# Patient Record
Sex: Female | Born: 2000 | Hispanic: No | Marital: Single | Smoking: Never smoker
Health system: Southern US, Community
[De-identification: ages and names within clinical notes are randomized; demographics above are authoritative.]

## PROBLEM LIST (undated history)

## (undated) DIAGNOSIS — Z889 Allergy status to unspecified drugs, medicaments and biological substances status: Secondary | ICD-10-CM

## (undated) DIAGNOSIS — F419 Anxiety disorder, unspecified: Secondary | ICD-10-CM

## (undated) DIAGNOSIS — F32A Depression, unspecified: Secondary | ICD-10-CM

## (undated) HISTORY — PX: WISDOM TOOTH EXTRACTION: SHX21

---
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
## 2020-01-03 ENCOUNTER — Encounter: Payer: Medicaid Other | Admitting: Family Medicine

## 2020-02-18 ENCOUNTER — Ambulatory Visit (INDEPENDENT_AMBULATORY_CARE_PROVIDER_SITE_OTHER): Payer: Medicaid Other | Admitting: Nurse Practitioner

## 2020-02-18 ENCOUNTER — Other Ambulatory Visit: Payer: Self-pay

## 2020-02-18 ENCOUNTER — Encounter: Payer: Self-pay | Admitting: Nurse Practitioner

## 2020-02-18 VITALS — BP 122/78 | HR 107 | Temp 98.0°F | Ht 62.0 in | Wt 194.6 lb

## 2020-02-18 DIAGNOSIS — E161 Other hypoglycemia: Secondary | ICD-10-CM | POA: Diagnosis not present

## 2020-02-18 DIAGNOSIS — Z1321 Encounter for screening for nutritional disorder: Secondary | ICD-10-CM | POA: Diagnosis not present

## 2020-02-18 DIAGNOSIS — Z79899 Other long term (current) drug therapy: Secondary | ICD-10-CM | POA: Diagnosis not present

## 2020-02-18 DIAGNOSIS — F419 Anxiety disorder, unspecified: Secondary | ICD-10-CM | POA: Diagnosis not present

## 2020-02-18 DIAGNOSIS — Z01419 Encounter for gynecological examination (general) (routine) without abnormal findings: Secondary | ICD-10-CM

## 2020-02-18 DIAGNOSIS — Z23 Encounter for immunization: Secondary | ICD-10-CM

## 2020-02-18 DIAGNOSIS — Z13220 Encounter for screening for lipoid disorders: Secondary | ICD-10-CM | POA: Diagnosis not present

## 2020-02-18 MED ORDER — SERTRALINE HCL 50 MG PO TABS: ORAL_TABLET | ORAL | 0 refills | Status: DC

## 2020-02-18 NOTE — Patient Instructions (Addendum)
Metamucil Fiber. This is a powder that can be mixed into your drink, it is flavorless and will help with constipation.    Try over the counter Omeprazole for reflux.     Barrett's Esophagus  Barrett's esophagus occurs when the tissue that lines the esophagus changes or becomes damaged. The esophagus is the tube that carries food from the throat to the stomach. With Barrett's esophagus, the cells that line the esophagus are replaced by cells that are similar to the lining of the intestines (intestinal metaplasia). Barrett's esophagus itself may not cause any symptoms. However, many people who have Barrett's esophagus also have gastroesophageal reflux disease (GERD), which may cause symptoms such as heartburn. Over time, a few people with this condition may develop cancer of the esophagus. Treatment may include medicines, procedures to destroy the abnormal cells, or surgery. What are the causes? The exact cause of this condition is not known. In some cases, the condition develops from damage to the lining of the esophagus caused by GERD. GERD occurs when stomach acids flow up from the stomach into the esophagus. Frequent symptoms of GERD may cause intestinal metaplasia or cause cell changes (dysplasia). What increases the risk? You are more likely to develop this condition if you:  Have GERD.  Are female.  Are Caucasian.  Are obese.  Are older than 50.  Have a hiatal hernia. This is a condition in which part of your stomach bulges into your chest.  Smoke. What are the signs or symptoms? People with Barrett's esophagus often have no symptoms. However, many people with this condition also have GERD. Symptoms of GERD may include:  Heartburn.  Difficulty swallowing.  Dry cough. How is this diagnosed? This condition may be diagnosed based on:  Results of an upper gastrointestinal endoscopy. For this exam, a thin, flexible tube with a light and a camera on the end (endoscope) is passed  down your esophagus. Your health care provider can view the inside of your esophagus during this procedure.  Results of a biopsy. For this procedure, several tissue samples are removed (biopsy) from your esophagus. They are then checked for intestinal metaplasia or dysplasia. How is this treated? Treatment for this condition may include:  Medicines (proton pump inhibitors, or PPIs) to decrease or stop GERD.  Periodic endoscopic exams to make sure that cancer is not developing.  A procedure or surgery for dysplasia. This may include: ? Removal or destruction of abnormal cells. ? Removal of part of the esophagus (esophagectomy). Follow these instructions at home: Eating and drinking  Eat more fruits and vegetables.  Avoid fatty foods.  Eat small, frequent meals instead of large meals.  Avoid foods that cause heartburn. These foods include: ? Coffee and alcoholic drinks. ? Tomatoes and foods made with tomatoes. ? Greasy or spicy foods. ? Chocolate and peppermint.  Do not drink alcohol. General instructions  Take over-the-counter and prescription medicines only as told by your health care provider.  Do not use any products that contain nicotine or tobacco, such as cigarettes and e-cigarettes. If you need help quitting, ask your health care provider.  If you are being treated for GERD, make sure you take medicines and follow all instructions as told by your health care provider.  Keep all follow-up visits as told by your health care provider. This is important. Contact a health care provider if:  You have heartburn or GERD symptoms.  You have difficulty swallowing. Get help right away if:  You have chest pain.  You are unable to swallow.  You vomit blood or material that looks like coffee grounds.  Your stool (feces) is bright red or dark. Summary  Barrett's esophagus occurs when the tissue that lines the esophagus changes or becomes damaged.  Barrett's esophagus  may be diagnosed with an upper gastrointestinal endoscopy and a biopsy.  Treatment may include medicines, procedures to remove abnormal cells, or surgery.  Follow your health care provider's instructions about what to eat and drink, what medicines to take, and when to call for help. This information is not intended to replace advice given to you by your health care provider. Make sure you discuss any questions you have with your health care provider. Document Revised: 08/25/2017 Document Reviewed: 08/25/2017 Elsevier Patient Education  [place].
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
## 2020-02-20 ENCOUNTER — Encounter: Payer: Self-pay | Admitting: Nurse Practitioner

## 2020-02-20 NOTE — Progress Notes (Signed)
   Subjective:    Patient ID: Michelle [name], female    DOB: 01/19/2001, 19 y.o.   MRN: [id number]  HPI    Review of Systems     Objective:   Physical Exam        Assessment & Plan:

## 2020-02-27 LAB — CBC WITH DIFFERENTIAL/PLATELET
Basophils Absolute: 0 10*3/uL (ref 0.0–0.2)
Basos: 1 %
EOS (ABSOLUTE): 0.1 10*3/uL (ref 0.0–0.4)
Eos: 1 %
Hematocrit: 38.1 % (ref 34.0–46.6)
Hemoglobin: 12.6 g/dL (ref 11.1–15.9)
Immature Grans (Abs): 0 10*3/uL (ref 0.0–0.1)
Immature Granulocytes: 0 %
Lymphocytes Absolute: 1.8 10*3/uL (ref 0.7–3.1)
Lymphs: 29 %
MCH: 28.3 pg (ref 26.6–33.0)
MCHC: 33.1 g/dL (ref 31.5–35.7)
MCV: 85 fL (ref 79–97)
Monocytes Absolute: 0.4 10*3/uL (ref 0.1–0.9)
Monocytes: 7 %
Neutrophils Absolute: 4 10*3/uL (ref 1.4–7.0)
Neutrophils: 62 %
Platelets: 317 10*3/uL (ref 150–450)
RBC: 4.46 x10E6/uL (ref 3.77–5.28)
RDW: 13.5 % (ref 11.7–15.4)
WBC: 6.4 10*3/uL (ref 3.4–10.8)

## 2020-02-27 LAB — COMPREHENSIVE METABOLIC PANEL
ALT: 14 IU/L (ref 0–32)
AST: 19 IU/L (ref 0–40)
Albumin/Globulin Ratio: 1.6 (ref 1.2–2.2)
Albumin: 4.7 g/dL (ref 3.9–5.0)
Alkaline Phosphatase: 93 IU/L (ref 42–106)
BUN/Creatinine Ratio: 10 (ref 9–23)
BUN: 6 mg/dL (ref 6–20)
Bilirubin Total: 0.4 mg/dL (ref 0.0–1.2)
CO2: 22 mmol/L (ref 20–29)
Calcium: 9.5 mg/dL (ref 8.7–10.2)
Chloride: 103 mmol/L (ref 96–106)
Creatinine, Ser: 0.58 mg/dL (ref 0.57–1.00)
GFR calc Af Amer: 155 mL/min/{1.73_m2} (ref >59)
GFR calc non Af Amer: 134 mL/min/{1.73_m2} (ref >59)
Globulin, Total: 2.9 g/dL (ref 1.5–4.5)
Glucose: 87 mg/dL (ref 65–99)
Potassium: 4.7 mmol/L (ref 3.5–5.2)
Sodium: 137 mmol/L (ref 134–144)
Total Protein: 7.6 g/dL (ref 6.0–8.5)

## 2020-02-27 LAB — LIPID PANEL
Chol/HDL Ratio: 4.1 ratio (ref 0.0–4.4)
Cholesterol, Total: 186 mg/dL — ABNORMAL HIGH (ref 100–169)
HDL: 45 mg/dL (ref >39)
LDL Chol Calc (NIH): 120 mg/dL — ABNORMAL HIGH (ref 0–109)
Triglycerides: 118 mg/dL — ABNORMAL HIGH (ref 0–89)
VLDL Cholesterol Cal: 21 mg/dL (ref 5–40)

## 2020-02-27 LAB — HEMOGLOBIN A1C
Est. average glucose Bld gHb Est-mCnc: 117 mg/dL
Hgb A1c MFr Bld: 5.7 % — ABNORMAL HIGH (ref 4.8–5.6)

## 2020-02-27 LAB — VITAMIN D 25 HYDROXY (VIT D DEFICIENCY, FRACTURES): Vit D, 25-Hydroxy: 13.8 ng/mL — ABNORMAL LOW (ref 30.0–100.0)

## 2020-02-27 LAB — INSULIN, FREE AND TOTAL
Free Insulin: 22 uU/mL — ABNORMAL HIGH
Total Insulin: 22 uU/mL

## 2020-02-28 ENCOUNTER — Telehealth: Payer: Self-pay | Admitting: *Deleted

## 2020-02-28 NOTE — Telephone Encounter (Signed)
Mom called requesting results of blood work from 10/8.

## 2020-02-29 ENCOUNTER — Other Ambulatory Visit: Payer: Self-pay | Admitting: Nurse Practitioner

## 2020-02-29 MED ORDER — VITAMIN D (ERGOCALCIFEROL) 1.25 MG (50000 UNIT) PO CAPS: 50000.0000 [IU] | ORAL_CAPSULE | ORAL | 2 refills | Status: DC

## 2020-02-29 NOTE — Telephone Encounter (Signed)
See note on lab report

## 2020-03-17 ENCOUNTER — Other Ambulatory Visit: Payer: Self-pay

## 2020-03-17 ENCOUNTER — Encounter: Payer: Medicaid Other | Admitting: Nurse Practitioner

## 2020-03-24 ENCOUNTER — Telehealth: Payer: Self-pay | Admitting: Family Medicine

## 2020-03-24 ENCOUNTER — Encounter: Payer: Medicaid Other | Admitting: Nurse Practitioner

## 2020-03-24 NOTE — Progress Notes (Deleted)
   Subjective:    Patient ID: Michelle [name], female    DOB: 2001/05/09, 19 y.o.   MRN: [id number]  HPI Pt here for follow up on Anxiety. Pt states she is doing well on Zoloft.  Virtual Visit via Video Note  I connected with Michelle [name] on 03/24/20 at 11:20 AM EST by a video enabled telemedicine application and verified that I am speaking with the correct person using two identifiers.  Location: Patient: home Provider: provider   I discussed the limitations of evaluation and management by telemedicine and the availability of in person appointments. The patient expressed understanding and agreed to proceed.  History of Present Illness:    Observations/Objective:   Assessment and Plan:   Follow Up Instructions:    I discussed the assessment and treatment plan with the patient. The patient was provided an opportunity to ask questions and all were answered. The patient agreed with the plan and demonstrated an understanding of the instructions.   The patient was advised to call back or seek an in-person evaluation if the symptoms worsen or if the condition fails to improve as anticipated.  I provided *** minutes of non-face-to-face time during this encounter.     Called at 12:08; no answer  Review of Systems     Objective:   Physical Exam        Assessment & Plan:

## 2020-03-24 NOTE — Telephone Encounter (Signed)
Ms. Michelle [name], Michelle [name] are scheduled for a virtual visit with your provider today.    Just as we do with appointments in the office, we must obtain your consent to participate.  Your consent will be active for this visit and any virtual visit you may have with one of our providers in the next 365 days.    If you have a MyChart account, I can also send a copy of this consent to you electronically.  All virtual visits are billed to your insurance company just like a traditional visit in the office.  As this is a virtual visit, video technology does not allow for your provider to perform a traditional examination.  This may limit your provider's ability to fully assess your condition.  If your provider identifies any concerns that need to be evaluated in person or the need to arrange testing such as labs, EKG, etc, we will make arrangements to do so.    Although advances in technology are sophisticated, we cannot ensure that it will always work on either your end or our end.  If the connection with a video visit is poor, we may have to switch to a telephone visit.  With either a video or telephone visit, we are not always able to ensure that we have a secure connection.   I need to obtain your verbal consent now.   Are you willing to proceed with your visit today?   Michelle [name] has provided verbal consent on 03/24/2020 for a virtual visit (video or telephone).   Vicente Males, LPN 20/60/1561  53:79 AM

## 2020-05-07 DIAGNOSIS — H5213 Myopia, bilateral: Secondary | ICD-10-CM | POA: Diagnosis not present

## 2020-07-07 ENCOUNTER — Ambulatory Visit: Payer: Medicaid Other | Admitting: Nurse Practitioner

## 2020-07-07 ENCOUNTER — Encounter: Payer: Self-pay | Admitting: Family Medicine

## 2020-07-28 ENCOUNTER — Encounter: Payer: Self-pay | Admitting: Family Medicine

## 2020-07-28 ENCOUNTER — Ambulatory Visit: Payer: Medicaid Other | Admitting: Family Medicine

## 2020-08-01 ENCOUNTER — Encounter: Payer: Self-pay | Admitting: Family Medicine

## 2021-06-19 ENCOUNTER — Ambulatory Visit
Admission: EM | Admit: 2021-06-19 | Discharge: 2021-06-19 | Disposition: A | Discharge status: Home / Self Care | Payer: Medicaid Other | Attending: Family Medicine | Admitting: Family Medicine

## 2021-06-19 ENCOUNTER — Ambulatory Visit (HOSPITAL_COMMUNITY)
Admission: EM | Admit: 2021-06-19 | Discharge: 2021-06-19 | Disposition: A | Discharge status: Home / Self Care | Payer: Medicaid Other | Attending: Registered Nurse | Admitting: Registered Nurse

## 2021-06-19 ENCOUNTER — Other Ambulatory Visit: Payer: Self-pay

## 2021-06-19 DIAGNOSIS — F32A Depression, unspecified: Secondary | ICD-10-CM

## 2021-06-19 DIAGNOSIS — F411 Generalized anxiety disorder: Secondary | ICD-10-CM

## 2021-06-19 DIAGNOSIS — F419 Anxiety disorder, unspecified: Secondary | ICD-10-CM | POA: Diagnosis not present

## 2021-06-19 DIAGNOSIS — F331 Major depressive disorder, recurrent, moderate: Secondary | ICD-10-CM

## 2021-06-19 MED ORDER — SERTRALINE HCL 50 MG PO TABS: 50.0000 mg | ORAL_TABLET | Freq: Every day | ORAL | 1 refills | Status: DC

## 2021-06-19 NOTE — Discharge Instructions (Signed)
[facility] Address: [address], [place], [place] [address] Phone: [phone]

## 2021-06-19 NOTE — ED Triage Notes (Signed)
Pt presents today for increase anxiety for the past several days. She just came out of a relationship and she is having trouble sleeping and eating. She reports taking zoloft in the past. She has been off zoloft for over a year.

## 2021-06-19 NOTE — ED Provider Notes (Signed)
[facility]    CSN: [id number] Arrival date & time: 06/19/21  1253      History   Chief Complaint Chief Complaint  Patient presents with   Anxiety    HPI Michelle [name] is a 21 y.o. female.   Presenting today with ongoing worsening issues with her anxiety following a break-up.  She states that there is a lot going on right now, many large stressors and she is having panic episodes overnight, having trouble sleeping and eating.  She used to be on Zoloft and did very well with this, no side effects while on the 50 mg dose.  She has been off for about a year now but is requesting to go back on the medication.  She is working on getting in with a primary care provider but wanting to go ahead and get started on the medication while waiting to get an appointment.  She denies any suicidal or homicidal ideation, thoughts of self-harm.   History reviewed. No pertinent past medical history.  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 No pertinent surgical history.  OB History   No obstetric history on file.    Home Medications    Prior to Admission medications   Medication Sig Start Date End Date Taking? Authorizing Provider  sertraline (ZOLOFT) 50 MG tablet Take 1 tablet (50 mg total) by mouth daily. 06/19/21   Volney American, PA-C  Vitamin D, Ergocalciferol, (DRISDOL) 1.25 MG (50000 UNIT) CAPS capsule Take 1 capsule (50,000 Units total) by mouth every 7 (seven) days. 02/29/20   Nilda Simmer, NP  metFORMIN (GLUCOPHAGE) 500 MG tablet Crush one tablet and take by mouth twice a day with foods 01/01/19 12/19/19  Nilda Simmer, NP   Family History History reviewed. No pertinent family history.  Social  History Social History   Tobacco Use   Smoking status: Never   Smokeless tobacco: Never  Vaping Use   Vaping Use: Never used  Substance Use Topics   Alcohol use: Never   Drug use: Never    Allergies   Patient has no known allergies.  Review of Systems Review of Systems Per HPI  Physical Exam Triage Vital Signs ED Triage Vitals [06/19/21 1316]  Enc Vitals Group     BP 137/86     Pulse Rate 99     Resp 16     Temp 98.1 F (36.7 C)     Temp Source Oral     SpO2 100 %     Weight      Height      Head Circumference      Peak Flow      Pain Score      Pain Loc      Pain Edu?      Excl. in [place]?    No data found.  Updated Vital Signs BP 137/86 (BP Location: Right Arm)    Pulse 99    Temp 98.1 F (36.7 C) (Oral)    Resp 16    LMP 05/16/2021 (Approximate)    SpO2 100%   Visual Acuity Right Eye Distance:   Left Eye Distance:   Bilateral Distance:    Right Eye Near:   Left Eye Near:    Bilateral Near:  Physical Exam Vitals and nursing note reviewed.  Constitutional:      Appearance: Normal appearance. She is not ill-appearing.  HENT:     Head: Atraumatic.     Mouth/Throat:     Mouth: Mucous membranes are moist.  Eyes:     Extraocular Movements: Extraocular movements intact.     Conjunctiva/sclera: Conjunctivae normal.  Cardiovascular:     Rate and Rhythm: Normal rate and regular rhythm.     Heart sounds: Normal heart sounds.  Pulmonary:     Effort: Pulmonary effort is normal.     Breath sounds: Normal breath sounds. No wheezing or rales.  Musculoskeletal:        General: Normal range of motion.     Cervical back: Normal range of motion and neck supple.  Skin:    General: Skin is warm and dry.  Neurological:     Mental Status: She is alert and oriented to person, place, and time.     Motor: No weakness.     Gait: Gait normal.  Psychiatric:        Mood and Affect: Mood normal.        Thought Content: Thought content normal.        Judgment:  Judgment normal.     UC Treatments / Results  Labs (all labs ordered are listed, but only abnormal results are displayed) Labs Reviewed - No data to display  EKG   Radiology No results found.  Procedures Procedures (including critical care time)  Medications Ordered in UC Medications - No data to display  Initial Impression / Assessment and Plan / UC Course  I have reviewed the triage vital signs and the nursing notes.  Pertinent labs & imaging results that were available during my care of the patient were reviewed by me and considered in my medical decision making (see chart for details).     Patient verbally declines any suicidal or homicidal ideation today.  She is requesting resources for counseling, primary care which were all given to her.  We will restart low-dose Zoloft while awaiting getting in with primary care or a specialist, discussed importance of close follow-up in the next few weeks to a month to check on how the medication is going.  She is aware to return sooner for any side effects or new concerns.  24-hour behavioral health urgent care information given additionally.  Final Clinical Impressions(s) / UC Diagnoses   Final diagnoses:  Anxiety and depression     Discharge Instructions      [facility] Address: [address] [place], [place] [address] Phone: [phone]    ED Prescriptions     Medication Sig Dispense Auth. Provider   sertraline (ZOLOFT) 50 MG tablet Take 1 tablet (50 mg total) by mouth daily. 30 tablet Volney American, [place]      PDMP not reviewed this encounter.   Volney American, [place] 06/19/21 1406

## 2021-06-19 NOTE — Progress Notes (Signed)
°   06/19/21 [place]  [place] (Walk-ins at [facility] only)  How Did You Hear About [place]? Self  What Is the Reason for Your Visit/Call Today? Pt is a 21 yo female who presented voluntarily and unaccompanied due to worsening depression and anxiety. Pt denied SI, HI, NSSH, AVH, paranoia and drug/alcohol use. Pt stated that she was started on a depression med, earlier today at an Urgent Care and was told to follow-up here. Pt stated that she broke up with her boyfriend of 3 years bout 2 months ago and has been getting more and more depressed since that time. Pt has no OP psych providers currently.  How Long Has This Been Causing You Problems? 1-6 months  Have You Recently Had Any Thoughts About Hurting Yourself? No  Are You Planning to Commit Suicide/Harm Yourself At This time? No  Have you Recently Had Thoughts About [place]? No  Are You Planning To Harm Someone At This Time? No  Are you currently experiencing any auditory, visual or other hallucinations? No  Have You Used Any Alcohol or Drugs in the Past 24 Hours? No  Do you have any current medical co-morbidities that require immediate attention? No  Clinician description of patient physical appearance/behavior: CAsually dressed, adequately groomed. Depressed mood with flat affect. Tearful at times. Alert and oriented x 4.  What Do You Feel Would Help You the Most Today? Treatment for Depression or other mood problem  If access to [facility] was not available, would you have sought care in the Emergency Department? Yes  Determination of Need Routine (7 days) (Per Ricky Ala NP, pt is recommended for OP treatment, PHP program.)   Lalitha Ilyas T. Mare Ferrari, Anton Ruiz, [facility], [facility] Triage Specialist [facility]

## 2021-06-19 NOTE — ED Provider Notes (Signed)
Behavioral Health Urgent Care Medical Screening Exam  Patient Name: Michelle [name] MRN: [id number] Date of Evaluation: 06/19/21 Chief Complaint:   Diagnosis:  Final diagnoses:  Moderate episode of recurrent major depressive disorder (HCC)  GAD (generalized anxiety disorder)    History of Present illness: Michelle [name] is a 21 y.o. female. patient presented to [facility] as a walk  complaints of worsening depression.  Michelle [name], 21 y.o., female patient seen face to face by this provider and chart reviewed on 06/19/21.  On evaluation Michelle [name] reports  Michelle [name] reported she was recently seen and evaluated by the local urgent care where she was restarted on Zoloft states she has been taking this medication as indicated however continues to express worsening anxiety and depressive symptoms.  She denies suicidal or homicidal ideations.  Denies auditory or visual hallucinations.  Continues to report ongoing ruminations related to a break-up.  Stated she and her boyfriend have been together for the past 3 years. "  We were together every day."  Discussed following up with partial hospitalization programming for additional coping skills and medication management.  Patient was receptive to plan.  During evaluation Michelle [name] is sitting  in no acute distress. She is alert/oriented x 4; calm/cooperative; and mood congruent with affect. She is speaking in a clear tone at moderate volume, and normal pace; with good eye contact. Her thought process is coherent and relevant; There is no indication that she is currently responding to internal/external stimuli or experiencing delusional thought content; and she has denied suicidal/self-harm/homicidal ideation, psychosis, and paranoia.  Patient has remained calm throughout assessment and has answered questions appropriately.     At this time Michelle [name] is educated and verbalizes understanding of mental health resources  and other crisis services in the community.She is instructed to call 911 and present to the nearest emergency room should she experience any suicidal/homicidal ideation, auditory/visual/hallucinations, or detrimental worsening of her mental health condition. She was a also advised by Probation officer that she could call the toll-free phone on insurance card to assist with identifying in network counselors and agencies or number on back of Medicaid card t speak with care coordinator    Psychiatric Specialty Exam  Presentation  General Appearance:Appropriate for Environment  Eye Contact:Good  Speech:Clear and Coherent  Speech Volume:Decreased  Handedness:No data recorded  Mood and Affect  Mood:Anxious (tearful)  Affect:Congruent   Thought Process  Thought Processes:Coherent  Descriptions of Associations:Intact  Orientation:Full (Time, Place and Person)  Thought Content:Logical    Hallucinations:None  Ideas of Reference:None  Suicidal Thoughts:No  Homicidal Thoughts:No   Sensorium  Memory:Immediate Fair; Recent Fair  Judgment:Fair  Insight:Fair   Executive Functions  Concentration:Fair  Attention Span:Good  [place]  Language:Good   Psychomotor Activity  Psychomotor Activity:Normal   Assets  Assets:Intimacy   Sleep  Sleep:Fair  Number of hours: No data recorded  Nutritional Assessment (For OBS and FBC admissions only) Has the patient had a weight loss or gain of 10 pounds or more in the last 3 months?: No Has the patient had a decrease in food intake/or appetite?: No Does the patient have dental problems?: No Does the patient have eating habits or behaviors that may be indicators of an eating disorder including binging or inducing vomiting?: No Has the patient recently lost weight without trying?: 0 Has the patient been eating poorly because of a decreased appetite?: 0 Malnutrition Screening Tool Score: 0    Physical  Exam: Physical Exam Vitals and nursing  note reviewed.  Cardiovascular:     Rate and Rhythm: Normal rate and regular rhythm.  Pulmonary:     Effort: Pulmonary effort is normal.  Neurological:     General: No focal deficit present.     Mental Status: She is alert and oriented to person, place, and time.  Psychiatric:        Mood and Affect: Mood normal.        Behavior: Behavior normal.        Thought Content: Thought content normal.   Review of Systems  Eyes: Negative.   Cardiovascular: Negative.   Gastrointestinal: Negative.   Psychiatric/Behavioral:  Positive for depression. The patient is nervous/anxious and has insomnia.   All other systems reviewed and are negative. There were no vitals taken for this visit. There is no height or weight on file to calculate BMI.  Musculoskeletal: Strength & Muscle Tone: within normal limits Gait & Station: normal Patient leans: N/A   [place] [facility] Discharge Disposition for Follow up and Recommendations: Based on my evaluation the patient does not appear to have an emergency medical condition and can be discharged with resources and follow up care in outpatient services for Medication Management, Partial Hospitalization Program, and Group Therapy   Derrill [place], NP 06/19/2021, 4:37 PM

## 2021-06-19 NOTE — Discharge Instructions (Signed)
Take all medications as prescribed. Keep all follow-up appointments as scheduled.  Do not consume alcohol or use illegal drugs while on prescription medications. Report any adverse effects from your medications to your primary care provider promptly.  In the event of recurrent symptoms or worsening symptoms, call 911, a crisis hotline, or go to the nearest emergency department for evaluation.   

## 2021-06-20 ENCOUNTER — Telehealth (HOSPITAL_COMMUNITY): Payer: Self-pay | Admitting: Licensed Clinical Social Worker

## 2021-06-25 ENCOUNTER — Telehealth (HOSPITAL_COMMUNITY): Payer: Self-pay | Admitting: Licensed Clinical Social Worker

## 2021-06-25 ENCOUNTER — Ambulatory Visit (HOSPITAL_COMMUNITY): Payer: Medicaid Other

## 2021-09-26 DIAGNOSIS — Z13220 Encounter for screening for lipoid disorders: Secondary | ICD-10-CM | POA: Diagnosis not present

## 2021-09-26 DIAGNOSIS — Z6829 Body mass index (BMI) 29.0-29.9, adult: Secondary | ICD-10-CM | POA: Diagnosis not present

## 2021-09-26 DIAGNOSIS — F064 Anxiety disorder due to known physiological condition: Secondary | ICD-10-CM | POA: Diagnosis not present

## 2021-09-26 DIAGNOSIS — E669 Obesity, unspecified: Secondary | ICD-10-CM | POA: Diagnosis not present

## 2021-09-26 DIAGNOSIS — Z131 Encounter for screening for diabetes mellitus: Secondary | ICD-10-CM | POA: Diagnosis not present

## 2021-09-29 NOTE — Progress Notes (Signed)
No show

## 2021-09-29 NOTE — Progress Notes (Signed)
Erroneous encounter

## 2022-02-11 DIAGNOSIS — B379 Candidiasis, unspecified: Secondary | ICD-10-CM | POA: Diagnosis not present

## 2022-02-11 DIAGNOSIS — M549 Dorsalgia, unspecified: Secondary | ICD-10-CM | POA: Diagnosis not present

## 2022-02-11 DIAGNOSIS — Z6835 Body mass index (BMI) 35.0-35.9, adult: Secondary | ICD-10-CM | POA: Diagnosis not present

## 2022-02-11 DIAGNOSIS — F064 Anxiety disorder due to known physiological condition: Secondary | ICD-10-CM | POA: Diagnosis not present

## 2022-02-11 DIAGNOSIS — Z0000 Encounter for general adult medical examination without abnormal findings: Secondary | ICD-10-CM | POA: Diagnosis not present

## 2022-02-11 DIAGNOSIS — E669 Obesity, unspecified: Secondary | ICD-10-CM | POA: Diagnosis not present

## 2022-03-07 ENCOUNTER — Ambulatory Visit: Payer: BC Managed Care – PPO | Admitting: Plastic Surgery

## 2022-03-07 ENCOUNTER — Encounter: Payer: Self-pay | Admitting: Plastic Surgery

## 2022-03-07 VITALS — BP 117/78 | HR 81 | Ht 63.0 in | Wt 208.0 lb

## 2022-03-07 DIAGNOSIS — M549 Dorsalgia, unspecified: Secondary | ICD-10-CM

## 2022-03-07 DIAGNOSIS — Z6836 Body mass index (BMI) 36.0-36.9, adult: Secondary | ICD-10-CM

## 2022-03-07 DIAGNOSIS — R293 Abnormal posture: Secondary | ICD-10-CM

## 2022-03-07 DIAGNOSIS — N62 Hypertrophy of breast: Secondary | ICD-10-CM | POA: Diagnosis not present

## 2022-03-07 DIAGNOSIS — R21 Rash and other nonspecific skin eruption: Secondary | ICD-10-CM

## 2022-03-07 NOTE — Progress Notes (Addendum)
Referring Provider No referring provider defined for this encounter.   CC:  Chief Complaint  Patient presents with   Advice Only      Michelle [name] is an 21 y.o. female.  HPI: 21 year old female referred for evaluation and management of symptomatic macromastia.  She relates a history of 5 back pain difficulty with upright posture difficulty with running and physical activity which she enjoys and persistent rashes between her breast due to the large size of her breast.  She is requesting surgical reduction in the size of her breast.  No Known Allergies  Outpatient Encounter Medications as of 03/07/2022  Medication Sig   sertraline (ZOLOFT) 50 MG tablet Take 1 tablet (50 mg total) by mouth daily. (Patient not taking: Reported on 03/07/2022)   Vitamin D, Ergocalciferol, (DRISDOL) 1.25 MG (50000 UNIT) CAPS capsule Take 1 capsule (50,000 Units total) by mouth every 7 (seven) days. (Patient not taking: Reported on 03/07/2022)   [DISCONTINUED] metFORMIN (GLUCOPHAGE) 500 MG tablet Crush one tablet and take by mouth twice a day with foods   No facility-administered encounter medications on file as of 03/07/2022.     No past medical history on file.  No past surgical history on file.  No family history on file.  Social History   Social History Narrative   Not on file     Review of Systems General: Denies fevers, chills, weight loss CV: Denies chest pain, shortness of breath, palpitations Breast: Large and pendulous bilaterally.  No obvious rashes today.  Physical Exam    03/07/2022   11:10 AM 06/19/2021    1:16 PM 02/18/2020   10:00 AM  Vitals with BMI  Height '5\' 3"'$   '5\' 2"'$   Weight 208 lbs  194 lbs 10 oz  BMI [id number]  [id number]  Systolic [id number] 0000000 [id number]  Diastolic 78 86 78  Pulse 81 99 107    General:  No acute distress,  Alert and oriented, Non-Toxic, Normal speech and affect Breast: Patient has bilateral macromastia.  Grade 3 ptosis.  She has obvious grooving from her bra  straps in her shoulders.  Her breasts are quite large with a sternal notch to nipple distance of 39 cm on the right and 39 cm on the left.  She has a fold to nipple distance of 12 cm.  She has no obvious nipple abnormalities no nipple discharge and no palpable dominant masses. Mammography: Patient has not started mammographic screening.  Assessment/Plan Symptomatic macromastia: She has extremely large breasts though her BMI is 36.8.  We discussed the fact that her weight probably contributes to the size of her breasts, however I do not believe that even with the weight loss that the patient would not have difficulty with the large size of her breast.  I believe I can easily resect greater than 700 g per breast.  The patient and I with her mother present discussed the procedure at great length.  I showed her where the incisions were and we discussed the resultant scars which can be quite large and hypertrophic.  The outcome of the scars is based purely on biology and each person scars differently we discussed this at length as well.  Additionally we discussed the risks of bleeding infection seroma formation.  We discussed the risk of nipple loss due to ischemia.  We discussed the possibility of changes in sensation in the nipple and areas of anesthesia throughout the breast.  We also discussed the possibility that she would not be able to  successfully breast-feed after a breast reduction.  We discussed the fact that the breast will continue to change shape with normal aging.  The fact that if she becomes pregnant that her breast will most likely hypertrophy during pregnancy and may or may not return to their prepregnancy size post delivery.  We discussed the morning of surgery and the conduct of the procedure including of drains and compression postoperatively and the restrictions and physical activity for 6 weeks which include no heavy lifting no vigorous exercise and no submerging the incisions in water.  She  indicated that all her questions had been answered to her satisfaction and requested that I submit her for consideration of a breast reduction.  Michelle [name] 03/07/2022, 1:10 PM

## 2022-04-11 ENCOUNTER — Ambulatory Visit (HOSPITAL_COMMUNITY): Payer: Medicaid Other | Attending: Plastic Surgery | Admitting: Physical Therapy

## 2022-04-11 DIAGNOSIS — M5459 Other low back pain: Secondary | ICD-10-CM | POA: Insufficient documentation

## 2022-04-11 DIAGNOSIS — M549 Dorsalgia, unspecified: Secondary | ICD-10-CM | POA: Insufficient documentation

## 2022-04-11 DIAGNOSIS — N62 Hypertrophy of breast: Secondary | ICD-10-CM | POA: Insufficient documentation

## 2022-04-11 NOTE — Therapy (Signed)
OUTPATIENT PHYSICAL THERAPY THORACOLUMBAR EVALUATION   Patient Name: Michelle [name] MRN: [id number] DOB:[birth year], 21 y.o., female Today's Date: 04/11/2022  END OF SESSION:  PT End of Session - 04/11/22 0903     Visit Number 1    Number of Visits 12    Date for PT Re-Evaluation 05/23/22    Authorization Type Medicaid healthy [employer]    Authorization Time Period check auth    Authorization - Visit Number 1    Authorization - Number of Visits 1    Progress Note Due on Visit 10    PT Start Time 0901    PT Stop Time 0941    PT Time Calculation (min) 40 min    Activity Tolerance Patient tolerated treatment well    Behavior During Therapy [facility] for tasks assessed/performed             No past medical history on file. No past surgical history on file. 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 She is consulting with surgeon for breast reduction surgery but must go through therapy first. She does work out regularly but continues to have ongoing pain in mid back and lumbar with prolonged activity. She also has intermittent bout of sciatica.   PERTINENT HISTORY:  NA  PAIN:  Are you having pain? Yes: NPRS scale:  4-5/10 Pain location: mid-low back  Pain description: sore, constant Aggravating factors: prolonged activity, bending, lifting  Relieving factors: rest, stretching   PRECAUTIONS: None  WEIGHT BEARING RESTRICTIONS: No  FALLS:  Has patient fallen in last 6 months? No  LIVING ENVIRONMENT: Lives with: lives with their family Lives in: House/apartment  OCCUPATION: Substitute teacher  PLOF: Independent  PATIENT GOALS: "Complete what I need to complete"  NEXT MD VISIT:   OBJECTIVE:   DIAGNOSTIC FINDINGS:  NA  PATIENT SURVEYS:  Modified Oswestry 12% (6/50)  COGNITION: Overall cognitive status: Within functional limits for tasks assessed     SENSATION: WFL  MUSCLE LENGTH: WFL  POSTURE: rounded shoulders  PALPATION: Mod TTP about bilateral lumbar and thoracic paraspinals   LUMBAR ROM:   AROM eval  Flexion Full  Extension Full  Right lateral flexion Full  Left lateral flexion Full  Right rotation   Left rotation    (Blank rows = not tested)  LOWER EXTREMITY ROM:    WNL   LOWER EXTREMITY MMT:    MMT Right eval Left eval  Hip flexion 4+ 4+  Hip extension 4 4  Hip abduction 4 4  Hip adduction    Hip internal rotation    Hip external rotation    Knee flexion    Knee extension 4 4  Ankle dorsiflexion 4+ 5  Ankle plantarflexion    Ankle inversion    Ankle eversion     (Blank rows = not tested)  LUMBAR SPECIAL TESTS:  (-) SLR, (+) Instability   GAIT:  TODAY'S TREATMENT:                                                                                                                              DATE: 04/11/22 Eval     PATIENT EDUCATION:  Education details: on eval findings, POC and HEP  Person educated: Patient Education method: Explanation Education comprehension: verbalized understanding  HOME EXERCISE PROGRAM: Access Code: [email or web address] URL: [email or web address] Date: 04/11/2022 Prepared by: Josue Hector  Exercises - Supine Transversus Abdominis Bracing - Hands on Stomach  - 2-3 x daily - 7 x weekly - 2 sets - 10 reps - 5 sec hold - Supine March  - 2-3 x daily - 7 x weekly - 2 sets - 10 reps - Supine Bridge  - 2-3 x daily - 7 x weekly - 2 sets - 10 reps - 5 sec hold - Sidelying Hip Abduction  - 2-3 x daily - 7 x weekly - 2 sets - 10 reps  ASSESSMENT:  CLINICAL IMPRESSION: Patient is a 21 y.o. female who presents to physical therapy with complaint of mid and low back pain. Patient demonstrates muscle weakness, postural deficits, and fascial restrictions which are likely contributing to symptoms of pain and are negatively impacting patient ability to perform ADLs and functional mobility tasks. Patient will benefit from skilled physical therapy services to address these deficits to reduce pain and improve level of function with ADLs and functional mobility tasks.   OBJECTIVE IMPAIRMENTS: decreased activity tolerance, decreased strength, increased fascial restrictions, improper body mechanics, postural dysfunction, and pain.   ACTIVITY LIMITATIONS: carrying, lifting, bending, standing, and squatting  PARTICIPATION LIMITATIONS: meal prep, cleaning, laundry, driving, shopping, community activity, occupation, and yard work  PERSONAL FACTORS:  none  are also affecting patient's functional outcome.   REHAB POTENTIAL: Excellent  CLINICAL DECISION MAKING: Stable/uncomplicated  EVALUATION COMPLEXITY: Low   GOALS: SHORT TERM GOALS: Target date: 05/02/2022  Patient will be  independent with initial HEP and self-management strategies to improve functional outcomes Baseline:  Goal status: INITIAL   LONG TERM GOALS: Target date: 05/23/2022  Patient will be independent with advanced HEP and self-management strategies to improve functional outcomes Baseline:  Goal status: INITIAL  2.  Patient will improve Oswestry score by at least 5% to indicate improvement in functional  outcomes Baseline: 12% disability Goal status: INITIAL  3.  Patient will report reduction of back pain to <3/10 with activity for improved quality of life and ability to perform ADLs  Baseline: 4-5 Goal status: INITIAL  4. Patient will have 5/5 MMT throughout BLE to improve ability to perform functional mobility, stair ambulation and ADLs.  Baseline: See MMT Goal status: INITIAL PLAN:  PT FREQUENCY: 1-2x/week  PT DURATION: 6 weeks  PLANNED INTERVENTIONS: Therapeutic exercises, Therapeutic activity, Neuromuscular re-education, Balance training, Gait training, Patient/Family education, Joint manipulation, Joint mobilization, Stair training, Aquatic Therapy, Dry Needling, Electrical stimulation, Spinal manipulation, Spinal mobilization, Cryotherapy, Moist heat, scar mobilization, Taping, Traction, Ultrasound, Biofeedback, Ionotophoresis '4mg'$ /ml Dexamethasone, and Manual therapy. Marland Kitchen  PLAN FOR NEXT SESSION: Progress glute and core strength with focus on improved posturing.   9:39 AM, 04/11/22 Josue Hector PT DPT  Physical Therapist with [facility]  [phone]

## 2022-04-17 ENCOUNTER — Ambulatory Visit (HOSPITAL_COMMUNITY): Payer: Medicaid Other | Attending: Plastic Surgery | Admitting: Physical Therapy

## 2022-04-17 DIAGNOSIS — M5459 Other low back pain: Secondary | ICD-10-CM | POA: Diagnosis not present

## 2022-04-17 DIAGNOSIS — M549 Dorsalgia, unspecified: Secondary | ICD-10-CM | POA: Diagnosis not present

## 2022-04-17 NOTE — Therapy (Signed)
OUTPATIENT PHYSICAL THERAPY THORACOLUMBAR TREATMENT   Patient Name: Michelle [name] MRN: [id number] DOB:[birth year], 21 y.o., female Today's Date: 04/17/2022  END OF SESSION:  PT End of Session - 04/17/22 1112     Visit Number 2    Number of Visits 12    Date for PT Re-Evaluation 05/23/22    Authorization Type Medicaid healthy Blue    Authorization Time Period 4 visits approved 1/30-1/11/23    Authorization - Visit Number 1    Authorization - Number of Visits 4    Progress Note Due on Visit 10    PT Start Time 1111    PT Stop Time 1150    PT Time Calculation (min) 39 min    Activity Tolerance Patient tolerated treatment well    Behavior During Therapy WFL for tasks assessed/performed             No past medical history on file. No past surgical history on file. 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 No pain currently. HEP was fairly easy. Has been compliant.    Eval: Patient presets with ongoing complaint of mid and low back pain. She is consulting with surgeon for breast reduction surgery but must go through therapy first. She does work out regularly but continues to have ongoing pain in mid back and lumbar with prolonged activity. She also has  intermittent bout of sciatica.   PERTINENT HISTORY:  NA  PAIN:  Are you having pain? Yes: NPRS scale: 0/10 Pain location: mid-low back  Pain description: sore, constant Aggravating factors: prolonged activity, bending, lifting  Relieving factors: rest, stretching   PRECAUTIONS: None  WEIGHT BEARING RESTRICTIONS: No  FALLS:  Has patient fallen in last 6 months? No  LIVING ENVIRONMENT: Lives with: lives with their family Lives in: House/apartment  OCCUPATION: Substitute teacher  PLOF: Independent  PATIENT GOALS: "Complete what I need to complete"  NEXT MD VISIT:  OBJECTIVE:   DIAGNOSTIC FINDINGS:  NA  PATIENT SURVEYS:  Modified Oswestry 12% (6/50)    COGNITION: Overall cognitive status: Within functional limits for tasks assessed     SENSATION: WFL  MUSCLE LENGTH: WFL  POSTURE: rounded shoulders  PALPATION: Mod TTP about bilateral lumbar and thoracic paraspinals   LUMBAR ROM:   AROM eval  Flexion Full  Extension Full  Right lateral flexion Full  Left lateral flexion Full  Right rotation   Left rotation    (Blank rows = not tested)  LOWER EXTREMITY ROM:    WNL   LOWER EXTREMITY MMT:    MMT Right eval Left eval  Hip flexion 4+ 4+  Hip extension 4 4  Hip abduction 4 4  Hip adduction    Hip internal rotation    Hip external rotation    Knee flexion    Knee extension 4 4  Ankle dorsiflexion 4+ 5  Ankle plantarflexion    Ankle inversion    Ankle eversion     (Blank rows = not tested)  LUMBAR SPECIAL TESTS:  (-) SLR, (+) Instability   GAIT:  TODAY'S TREATMENT:                                                                                                                              DATE:  04/17/22 Ab brace 10 x 5" Ab march x 20  SLR with ab brace x 20 each  Dead bug 2 x 10 Bridge x20  Hip abduction iso 10 x 5" GTB  Hip adduction iso 10 x 5"  Band row GTB 2 x 10 Band shoulder extension  GTB 2 x 10 Horizontal shoulder  abduction with GTB 2 x 10  Standing pec stretch at wall 2 x 20"  04/11/22 Eval     PATIENT EDUCATION:  Education details: on eval findings, POC and HEP  Person educated: Patient Education method: Explanation Education comprehension: verbalized understanding  HOME EXERCISE PROGRAM: Access Code: [email or web address] URL: [email or web address] 04/17/22 - Hooklying Clamshell with Resistance  - 2-3 x daily - 7 x weekly - 2 sets - 10 reps - 5 second hold - Supine Hip Adduction Isometric with Ball  - 2-3 x daily - 7 x weekly - 2 sets - 10 reps - 5 second hold - Single Arm Doorway Pec Stretch at 90 Degrees Abduction  - 2-3 x daily - 7 x weekly - 1 sets - 3 reps - 30 second hold - Shoulder extension with resistance - Neutral  - 2-3 x daily - 7 x weekly - 2 sets - 10 reps - 5 second hold - Standing Shoulder Horizontal Abduction with Resistance  - 2-3 x daily - 7 x weekly - 2 sets - 10 reps - 5 second hold  Date: 04/11/2022 Prepared by: Josue Hector  Exercises - Supine Transversus Abdominis Bracing - Hands on Stomach  - 2-3 x daily - 7 x weekly - 2 sets - 10 reps -  5 sec hold - Supine March  - 2-3 x daily - 7 x weekly - 2 sets - 10 reps - Supine Bridge  - 2-3 x daily - 7 x weekly - 2 sets - 10 reps - 5 sec hold - Sidelying Hip Abduction  - 2-3 x daily - 7 x weekly - 2 sets - 10 reps  ASSESSMENT:  CLINICAL IMPRESSION: Progressed postural strengthening. Patient noting some discomfort about Rt SI with bridging, but improved following hip abduction/ adduction series. Ongoing challenge with deadbugs for core, noting increased pressure in RT side low back. Added band exercises and standing pec stretching at wall. Issued updated HEP handout. Patient will continue to benefit from skilled therapy services to reduce remaining deficits and improve functional ability.    OBJECTIVE IMPAIRMENTS: decreased activity tolerance, decreased strength, increased fascial restrictions, improper body  mechanics, postural dysfunction, and pain.   ACTIVITY LIMITATIONS: carrying, lifting, bending, standing, and squatting  PARTICIPATION LIMITATIONS: meal prep, cleaning, laundry, driving, shopping, community activity, occupation, and yard work  PERSONAL FACTORS:  none  are also affecting patient's functional outcome.   REHAB POTENTIAL: Excellent  CLINICAL DECISION MAKING: Stable/uncomplicated  EVALUATION COMPLEXITY: Low   GOALS: SHORT TERM GOALS: Target date: 05/02/2022  Patient will be independent with initial HEP and self-management strategies to improve functional outcomes Baseline:  Goal status: INITIAL   LONG TERM GOALS: Target date: 05/23/2022  Patient will be independent with advanced HEP and self-management strategies to improve functional outcomes Baseline:  Goal status: INITIAL  2.  Patient will improve Oswestry score by at least 5% to indicate improvement in functional outcomes Baseline: 12% disability Goal status: INITIAL  3.  Patient will report reduction of back pain to <3/10 with activity for improved quality of life and ability to perform ADLs  Baseline: 4-5 Goal status: INITIAL  4. Patient will have 5/5 MMT throughout BLE to improve ability to perform functional mobility, stair ambulation and ADLs.  Baseline: See MMT Goal status: INITIAL PLAN:  PT FREQUENCY: 1-2x/week  PT DURATION: 6 weeks  PLANNED INTERVENTIONS: Therapeutic exercises, Therapeutic activity, Neuromuscular re-education, Balance training, Gait training, Patient/Family education, Joint manipulation, Joint mobilization, Stair training, Aquatic Therapy, Dry Needling, Electrical stimulation, Spinal manipulation, Spinal mobilization, Cryotherapy, Moist heat, scar mobilization, Taping, Traction, Ultrasound, Biofeedback, Ionotophoresis '4mg'$ /ml Dexamethasone, and Manual therapy. Marland Kitchen  PLAN FOR NEXT SESSION: Progress glute and core strength with focus on improved posturing.   11:14 AM,  04/17/22 Josue Hector PT DPT  Physical Therapist with [facility]  [phone]

## 2022-04-22 ENCOUNTER — Encounter (HOSPITAL_COMMUNITY): Payer: Medicaid Other | Admitting: Physical Therapy

## 2022-04-27 ENCOUNTER — Ambulatory Visit
Admission: RE | Admit: 2022-04-27 | Discharge: 2022-04-27 | Disposition: A | Discharge status: Home / Self Care | Payer: Medicaid Other | Source: Ambulatory Visit | Attending: Family Medicine | Admitting: Family Medicine

## 2022-04-27 VITALS — BP 109/74 | HR 83 | Temp 98.2°F | Resp 18

## 2022-04-27 DIAGNOSIS — Z7984 Long term (current) use of oral hypoglycemic drugs: Secondary | ICD-10-CM | POA: Diagnosis not present

## 2022-04-27 DIAGNOSIS — J029 Acute pharyngitis, unspecified: Secondary | ICD-10-CM | POA: Diagnosis not present

## 2022-04-27 DIAGNOSIS — Z1152 Encounter for screening for COVID-19: Secondary | ICD-10-CM | POA: Diagnosis not present

## 2022-04-27 DIAGNOSIS — J069 Acute upper respiratory infection, unspecified: Secondary | ICD-10-CM | POA: Diagnosis not present

## 2022-04-27 DIAGNOSIS — R058 Other specified cough: Secondary | ICD-10-CM | POA: Insufficient documentation

## 2022-04-27 DIAGNOSIS — H6993 Unspecified Eustachian tube disorder, bilateral: Secondary | ICD-10-CM

## 2022-04-27 LAB — RESP PANEL BY RT-PCR (FLU A&B, COVID) ARPGX2
Influenza A by PCR: NEGATIVE
Influenza B by PCR: NEGATIVE
SARS Coronavirus 2 by RT PCR: NEGATIVE

## 2022-04-27 LAB — POCT RAPID STREP A (OFFICE): Rapid Strep A Screen: NEGATIVE

## 2022-04-27 MED ORDER — FLUTICASONE PROPIONATE 50 MCG/ACT NA SUSP: 1.0000 | Freq: Two times a day (BID) | NASAL | 2 refills | Status: DC

## 2022-04-27 MED ORDER — PSEUDOEPHEDRINE HCL ER 120 MG PO TB12: 120.0000 mg | ORAL_TABLET | Freq: Two times a day (BID) | ORAL | 0 refills | Status: DC | PRN

## 2022-04-27 MED ORDER — PROMETHAZINE-DM 6.25-15 MG/5ML PO SYRP: 5.0000 mL | ORAL_SOLUTION | Freq: Four times a day (QID) | ORAL | 0 refills | Status: DC | PRN

## 2022-04-27 NOTE — Discharge Instructions (Addendum)
Your strep test was negative today.  We have also tested you for COVID and flu and will let you know if anything comes back positive in the next day or so.  I recommend the medications that I have sent over to help with your symptoms.  You can also take ibuprofen, Tylenol, DayQuil, NyQuil

## 2022-04-27 NOTE — ED Triage Notes (Signed)
Pt reports left ear pain x 2 days. Then yesterday it felt clogged. Took ear drops but made it worst. Sore throat blood in mucus, and headace since yesterday, tea halls and cough meds which gave slight relief.    *Pt cannot take pill form of medication.*

## 2022-04-27 NOTE — ED Provider Notes (Signed)
[facility]    CSN: [id number] Arrival date & time: 04/27/22  0859      History   Chief Complaint Chief Complaint  Patient presents with   Ear Fullness    Entered by patient    HPI Michelle [name] is a 21 y.o. female.   Presenting today with 2-day history of left ear pain and pressure, sore throat, cough, congestion, sinus pressure, fatigue.  Denies fever, body aches, chest pain, shortness of breath, abdominal pain, nausea vomiting or diarrhea.  Trying eardrops and hot tea with lemon and ginger with no relief.  Multiple sick contacts recently.    History reviewed. No pertinent past medical history.  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 No pertinent surgical history.  OB History   No obstetric history on file.      Home Medications    Prior to Admission medications   Medication Sig Start Date End Date Taking? Authorizing Provider  fluticasone (FLONASE) 50 MCG/ACT nasal spray Place 1 spray into both nostrils 2 (two) times daily. 04/27/22  Yes Volney American, PA-C  promethazine-dextromethorphan (PROMETHAZINE-DM) 6.25-15 MG/5ML syrup Take 5 mLs by mouth 4 (four) times daily as needed. 04/27/22  Yes Volney American, PA-C  pseudoephedrine (SUDAFED 12 HOUR) 120 MG 12 hr tablet Take 1 tablet (120 mg total) by mouth every 12 (twelve) hours as needed for congestion. 04/27/22  Yes Volney American, PA-C  sertraline (ZOLOFT) 50 MG tablet Take 1 tablet (50 mg total) by mouth daily. Patient not taking: Reported on 03/07/2022 06/19/21   Volney American, PA-C  Vitamin D, Ergocalciferol, (DRISDOL) 1.25 MG (50000 UNIT) CAPS capsule Take 1 capsule (50,000 Units total) by mouth every  7 (seven) days. Patient not taking: Reported on 03/07/2022 02/29/20   Nilda Simmer, NP  metFORMIN (GLUCOPHAGE) 500 MG tablet Crush one tablet and take by mouth twice a day with foods 01/01/19 12/19/19  Nilda Simmer, NP    Family History History reviewed. No pertinent family history.  Social History Social History   Tobacco Use   Smoking status: Never   Smokeless tobacco: Never  Vaping Use   Vaping Use: Never used  Substance Use Topics   Alcohol use: Never   Drug use: Never     Allergies   Patient has no known allergies.   Review of Systems Review of Systems Per HPI  Physical Exam Triage Vital Signs ED Triage Vitals  Enc Vitals Group     BP 04/27/22 0920 109/74     Pulse Rate 04/27/22 0920 83     Resp 04/27/22 0920 18     Temp 04/27/22 0920 98.2 F (36.8 C)     Temp Source 04/27/22 0920 Oral     SpO2 04/27/22 0920 98 %     Weight --      Height --      Head Circumference --      Peak Flow --      Pain Score 04/27/22 0926 4     Pain Loc --      Pain Edu? --      Excl. in [place]? --    No data found.  Updated Vital Signs BP 109/74 (BP Location: Right Arm)   Pulse 83  Temp 98.2 F (36.8 C) (Oral)   Resp 18   LMP 04/25/2022 (Exact Date)   SpO2 98%   Visual Acuity Right Eye Distance:   Left Eye Distance:   Bilateral Distance:    Right Eye Near:   Left Eye Near:    Bilateral Near:     Physical Exam Vitals and nursing note reviewed.  Constitutional:      Appearance: Normal appearance.  HENT:     Head: Atraumatic.     Right Ear: External ear normal. There is impacted cerumen.     Left Ear: External ear normal.     Ears:     Comments: Left middle ear effusion    Nose: Rhinorrhea present.     Mouth/Throat:     Mouth: Mucous membranes are moist.     Pharynx: Posterior oropharyngeal erythema present.  Eyes:     Extraocular Movements: Extraocular movements intact.     Conjunctiva/sclera: Conjunctivae normal.  Cardiovascular:     Rate and  Rhythm: Normal rate and regular rhythm.     Heart sounds: Normal heart sounds.  Pulmonary:     Effort: Pulmonary effort is normal.     Breath sounds: Normal breath sounds. No wheezing or rales.  Musculoskeletal:        General: Normal range of motion.     Cervical back: Normal range of motion and neck supple.  Skin:    General: Skin is warm and dry.  Neurological:     Mental Status: She is alert and oriented to person, place, and time.  Psychiatric:        Mood and Affect: Mood normal.        Thought Content: Thought content normal.      UC Treatments / Results  Labs (all labs ordered are listed, but only abnormal results are displayed) Labs Reviewed  RESP PANEL BY RT-PCR (FLU A&B, COVID) [id number]  POCT RAPID STREP A (OFFICE)    EKG   Radiology No results found.  Procedures Procedures (including critical care time)  Medications Ordered in UC Medications - No data to display  Initial Impression / Assessment and Plan / UC Course  I have reviewed the triage vital signs and the nursing notes.  Pertinent labs & imaging results that were available during my care of the patient were reviewed by me and considered in my medical decision making (see chart for details).     Rapid strep negative, respiratory panel pending.  Suspect viral upper respiratory infection causing a middle ear effusion.  Treat with Flonase, Sudafed, Phenergan DM, supportive over-the-counter medications and home care.  Return for worsening symptoms.  Final Clinical Impressions(s) / UC Diagnoses   Final diagnoses:  Viral URI with cough  Dysfunction of both eustachian tubes     Discharge Instructions      Your strep test was negative today.  We have also tested you for COVID and flu and will let you know if anything comes back positive in the next day or so.  I recommend the medications that I have sent over to help with your symptoms.  You can also take ibuprofen, Tylenol, DayQuil, NyQuil    ED  Prescriptions     Medication Sig Dispense Auth. Provider   promethazine-dextromethorphan (PROMETHAZINE-DM) 6.25-15 MG/5ML syrup Take 5 mLs by mouth 4 (four) times daily as needed. 100 mL Volney American, PA-C   fluticasone [facility]) 50 MCG/ACT nasal spray Place 1 spray into both nostrils 2 (two) times daily. 16 g  Volney American, PA-C   pseudoephedrine (SUDAFED 12 HOUR) 120 MG 12 hr tablet Take 1 tablet (120 mg total) by mouth every 12 (twelve) hours as needed for congestion. 20 tablet Volney American, [place]      PDMP not reviewed this encounter.   Volney American, [place] 04/27/22 1021

## 2022-05-01 ENCOUNTER — Telehealth (HOSPITAL_COMMUNITY): Payer: Self-pay

## 2022-05-01 ENCOUNTER — Encounter (HOSPITAL_COMMUNITY): Payer: Medicaid Other

## 2022-05-01 NOTE — Telephone Encounter (Signed)
No show #2, called and spoke to pt concerning missed apt today. Pt stated she called to cancel at 11:20 today. Pt educated on no show policy and need to schedule apt one at a time. Reminded next apt date and time with contact number included if needs to cancel/reschedule in the future.   Ihor Austin, LPTA/CLT; Delana Meyer [phone]

## 2022-05-07 ENCOUNTER — Encounter (HOSPITAL_COMMUNITY): Payer: Self-pay

## 2022-05-07 ENCOUNTER — Ambulatory Visit (HOSPITAL_COMMUNITY): Payer: Medicaid Other

## 2022-05-07 DIAGNOSIS — M5459 Other low back pain: Secondary | ICD-10-CM | POA: Diagnosis not present

## 2022-05-07 DIAGNOSIS — M549 Dorsalgia, unspecified: Secondary | ICD-10-CM

## 2022-05-07 NOTE — Therapy (Signed)
OUTPATIENT PHYSICAL THERAPY THORACOLUMBAR TREATMENT   Patient Name: Michelle [name] MRN: [id number] DOB:24-Jun-2000, 21 y.o., female Today's Date: 05/07/2022  END OF SESSION:  PT End of Session - 05/07/22 0910     Visit Number 3    Number of Visits 12    Date for PT Re-Evaluation 05/23/22    Authorization Type Medicaid healthy Blue    Authorization Time Period 4 visits approved 1/30-1/11/23    Authorization - Visit Number 2    Authorization - Number of Visits 4    Progress Note Due on Visit 10    PT Start Time 0821    PT Stop Time 0859    PT Time Calculation (min) 38 min    Activity Tolerance Patient tolerated treatment well    Behavior During Therapy [facility] for tasks assessed/performed              History reviewed. No pertinent past medical history. History reviewed. No pertinent surgical history. 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  HEP is going well and goes to [facility] for weight lifting, has been doing more cardio and plans for legs today   Eval: Patient presets with ongoing complaint of mid and low back pain. She is consulting with surgeon for breast reduction surgery but must go through therapy  first. She does work out regularly but continues to have ongoing pain in mid back and lumbar with prolonged activity. She also has intermittent bout of sciatica.   PERTINENT HISTORY:  NA  PAIN:  Are you having pain? Yes: NPRS scale: 0/10 Pain location: mid-low back  Pain description: sore, constant Aggravating factors: prolonged activity, bending, lifting  Relieving factors: rest, stretching   PRECAUTIONS: None  WEIGHT BEARING RESTRICTIONS: No  FALLS:  Has patient fallen in last 6 months? No  LIVING ENVIRONMENT: Lives with: lives with their family Lives in:  House/apartment  OCCUPATION: Oceanographer  PLOF: Independent  PATIENT GOALS: "Complete what I need to complete"  NEXT MD VISIT:   OBJECTIVE:   DIAGNOSTIC FINDINGS:  NA  PATIENT SURVEYS:  Modified Oswestry 12% (6/50)    COGNITION: Overall cognitive status: Within functional limits for tasks assessed     SENSATION: WFL  MUSCLE LENGTH: WFL  POSTURE: rounded shoulders  PALPATION: Mod TTP about bilateral lumbar and thoracic paraspinals   LUMBAR ROM:   AROM eval  Flexion Full  Extension Full  Right lateral flexion Full  Left lateral flexion Full  Right rotation   Left rotation    (Blank rows = not tested)  LOWER EXTREMITY ROM:    WNL   LOWER EXTREMITY MMT:    MMT Right eval Left eval  Hip flexion 4+ 4+  Hip extension 4 4  Hip abduction 4 4  Hip adduction    Hip internal rotation    Hip external rotation    Knee flexion    Knee extension 4 4  Ankle dorsiflexion 4+ 5  Ankle plantarflexion    Ankle inversion    Ankle eversion     (Blank rows = not tested)  LUMBAR SPECIAL TESTS:  (-) SLR, (+) Instability   GAIT:  TODAY'S TREATMENT:                                                                                                                              DATE:  05/07/22: Quadruped:  UE/LE 10x 5" 2# bar across lower back Prone: (pillow under ASIS) Row 3# then 5# 10x 5" 2  sets Plank 3x10" Supine: dead bug 20x bridge  Seated anterior/posterior rotation (increase pressure with palpation PIIS)  04/17/22 Ab brace 10 x 5" Ab march x 20  SLR with ab brace x 20 each  Dead bug 2 x 10 Bridge x20  Hip abduction iso 10 x 5" GTB  Hip adduction iso 10 x 5"  Band row GTB 2 x 10 Band shoulder extension  GTB 2 x 10 Horizontal shoulder abduction with GTB 2 x 10  Standing pec stretch at wall 2 x 20"  04/11/22 Eval     PATIENT EDUCATION:  Education details: on eval findings, POC and HEP  Person educated: Patient Education method: Explanation Education comprehension: verbalized understanding  HOME EXERCISE PROGRAM: Access Code: [email or web address] URL: [email or web address] 04/17/22 - Hooklying Clamshell with Resistance  - 2-3 x daily - 7 x weekly - 2 sets - 10 reps - 5 second hold - Supine Hip Adduction Isometric with Ball  - 2-3 x daily - 7 x weekly - 2 sets - 10 reps - 5 second hold - Single Arm Doorway Pec Stretch at 90 Degrees Abduction  - 2-3 x daily - 7 x weekly - 1 sets - 3 reps - 30 second hold - Shoulder extension with resistance - Neutral  - 2-3 x daily - 7 x weekly - 2 sets - 10 reps -  5 second hold - Standing Shoulder Horizontal Abduction with Resistance  - 2-3 x daily - 7 x weekly - 2 sets - 10 reps - 5 second hold  Date: 04/11/2022 Prepared by: Josue Hector  Exercises - Supine Transversus Abdominis Bracing - Hands on Stomach  - 2-3 x daily - 7 x weekly - 2 sets - 10 reps - 5 sec hold - Supine March  - 2-3 x daily - 7 x weekly - 2 sets - 10 reps - Supine Bridge  - 2-3 x daily - 7 x weekly - 2 sets - 10 reps - 5 sec hold - Sidelying Hip Abduction  - 2-3 x daily - 7 x weekly - 2 sets - 10 reps  ASSESSMENT:  CLINICAL IMPRESSION: Session focus on core and postural strengthening.  Added quadruped exercise with barbell over SI to improve self awareness for core strengthening.  Pt with c/o discomfort sacrum/PSIS during bridging and prone  exercises.  Pt with tendency to sit with anterior rotated pelvis, educated on responsibility of core to improve seated posture.     OBJECTIVE IMPAIRMENTS: decreased activity tolerance, decreased strength, increased fascial restrictions, improper body mechanics, postural dysfunction, and pain.   ACTIVITY LIMITATIONS: carrying, lifting, bending, standing, and squatting  PARTICIPATION LIMITATIONS: meal prep, cleaning, laundry, driving, shopping, community activity, occupation, and yard work  PERSONAL FACTORS:  none  are also affecting patient's functional outcome.   REHAB POTENTIAL: Excellent  CLINICAL DECISION MAKING: Stable/uncomplicated  EVALUATION COMPLEXITY: Low   GOALS: SHORT TERM GOALS: Target date: 05/02/2022  Patient will be independent with initial HEP and self-management strategies to improve functional outcomes Baseline:  Goal status: IN PROGRESS   LONG TERM GOALS: Target date: 05/23/2022  Patient will be independent with advanced HEP and self-management strategies to improve functional outcomes Baseline:  Goal status: IN PROGRESS  2.  Patient will improve Oswestry score by at least 5% to indicate improvement in functional outcomes Baseline: 12% disability Goal status: IN PROGRESS  3.  Patient will report reduction of back pain to <3/10 with activity for improved quality of life and ability to perform ADLs  Baseline: 4-5 Goal status: IN PROGRESS  4. Patient will have 5/5 MMT throughout BLE to improve ability to perform functional mobility, stair ambulation and ADLs.  Baseline: See MMT Goal status: IN PROGRESS PLAN:  PT FREQUENCY: 1-2x/week  PT DURATION: 6 weeks  PLANNED INTERVENTIONS: Therapeutic exercises, Therapeutic activity, Neuromuscular re-education, Balance training, Gait training, Patient/Family education, Joint manipulation, Joint mobilization, Stair training, Aquatic Therapy, Dry Needling, Electrical stimulation, Spinal manipulation, Spinal  mobilization, Cryotherapy, Moist heat, scar mobilization, Taping, Traction, Ultrasound, Biofeedback, Ionotophoresis '4mg'$ /ml Dexamethasone, and Manual therapy. Marland Kitchen  PLAN FOR NEXT SESSION: Progress glute and core strength with focus on improved posturing.   Ihor Austin, LPTA/CLT; [facility]IS [phone]  9:11 AM, 05/07/22

## 2022-05-15 ENCOUNTER — Ambulatory Visit (HOSPITAL_COMMUNITY): Payer: Medicaid Other | Attending: Plastic Surgery

## 2022-05-15 ENCOUNTER — Encounter (HOSPITAL_COMMUNITY): Payer: Self-pay

## 2022-05-15 ENCOUNTER — Encounter (HOSPITAL_COMMUNITY): Payer: Medicaid Other | Admitting: Physical Therapy

## 2022-05-15 DIAGNOSIS — M549 Dorsalgia, unspecified: Secondary | ICD-10-CM | POA: Diagnosis present

## 2022-05-15 DIAGNOSIS — M5459 Other low back pain: Secondary | ICD-10-CM | POA: Diagnosis present

## 2022-05-15 NOTE — Therapy (Addendum)
OUTPATIENT PHYSICAL THERAPY THORACOLUMBAR TREATMENT   Patient Name: Michelle [name] MRN: [id number] DOB:06/13/2000, 22 y.o., female Today's Date: 05/15/2022  END OF SESSION:  PT End of Session - 05/15/22 1123     Visit Number 4    Number of Visits 12    Date for PT Re-Evaluation 05/23/22    Authorization Type Medicaid healthy Blue    Authorization Time Period 4 visits approved 1/30-1/11/23    Authorization - Visit Number 3    Authorization - Number of Visits 4    Progress Note Due on Visit 10    PT Start Time 6203    PT Stop Time 5597    PT Time Calculation (min) 39 min    Activity Tolerance Patient tolerated treatment well    Behavior During Therapy WFL for tasks assessed/performed              History reviewed. No pertinent past medical history. History reviewed. No pertinent surgical history. 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   PMDD (premenstrual dysphoric disorder) 01/24/2017    PCP: Marena Chancy   REFERRING PROVIDER: Camillia Herter, MD  REFERRING DIAG: N62 (ICD-10-CM) - Macromastia  Rationale for Evaluation and Treatment: Rehabilitation  THERAPY DIAG:  Other low back pain  Mid back pain  ONSET DATE: Chronic  SUBJECTIVE:                                                                                                                                                                                           SUBJECTIVE STATEMENT: Pt stated she went for 6 hr hike in [place] for new years with some groin pain following.  Reports she went to gym yesterday and focused on legs.  Has added planks and new HEP without questions.  No reports of pain today.  Stated she wishes for breast reduction to help with mid and lower back pain.   Eval: Patient presets with ongoing  complaint of mid and low back pain. She is consulting with surgeon for breast reduction surgery but must go through therapy first. She does work out regularly but continues to have ongoing pain in mid back and lumbar with prolonged activity. She also has intermittent bout of sciatica.   PERTINENT HISTORY:  NA  PAIN:  Are you having pain? Yes: NPRS scale: 0/10 Pain location: mid-low back  Pain description: sore, constant Aggravating factors: prolonged activity, bending, lifting  Relieving factors: rest, stretching   PRECAUTIONS: None  WEIGHT  BEARING RESTRICTIONS: No  FALLS:  Has patient fallen in last 6 months? No  LIVING ENVIRONMENT: Lives with: lives with their family Lives in: House/apartment  OCCUPATION: Substitute teacher  PLOF: Independent  PATIENT GOALS: "Complete what I need to complete"  NEXT MD VISIT:   OBJECTIVE:   DIAGNOSTIC FINDINGS:  NA  PATIENT SURVEYS:  Modified Oswestry 12% (6/50)    COGNITION: Overall cognitive status: Within functional limits for tasks assessed     SENSATION: WFL  MUSCLE LENGTH: WFL  POSTURE: rounded shoulders  PALPATION: Mod TTP about bilateral lumbar and thoracic paraspinals   LUMBAR ROM:   AROM eval  Flexion Full  Extension Full  Right lateral flexion Full  Left lateral flexion Full  Right rotation   Left rotation    (Blank rows = not tested)  LOWER EXTREMITY ROM:    WNL   LOWER EXTREMITY MMT:    MMT Right eval Left eval  Hip flexion 4+ 4+  Hip extension 4 4  Hip abduction 4 4  Hip adduction    Hip internal rotation    Hip external rotation    Knee flexion    Knee extension 4 4  Ankle dorsiflexion 4+ 5  Ankle plantarflexion    Ankle inversion    Ankle eversion     (Blank rows = not tested)  LUMBAR SPECIAL TESTS:  (-) SLR, (+) Instability   GAIT:  TODAY'S TREATMENT:                                                                                                                               DATE:  05/15/22: Discussed dynamic vs static stretches before and after exercise. Sumo squat with 10# 8 x Prone: Row 5# 10x 2 sets 5" Shoulder extension 10x 5" Seated: anterior/posterior rotation with tactile cueing to improve neutral prior seated postural strengthening. Wback 5# 2x 10 with ab set Quadruped:  UE/LE 10x 5" 2# bar across lower back 05/07/22: Quadruped:  UE/LE 10x 5" 2# bar across lower back Prone: (pillow under ASIS) Row 3# then 5# 10x 5" 2 sets Plank 3x10" Supine: dead bug 20x bridge  Seated anterior/posterior rotation (increase pressure with palpation PIIS)  04/17/22 Ab brace 10 x 5" Ab march x 20  SLR with ab brace x 20 each  Dead bug 2 x 10 Bridge x20  Hip abduction iso 10 x 5" GTB  Hip adduction iso 10 x 5"  Band row GTB 2 x 10 Band shoulder extension  GTB 2 x 10 Horizontal shoulder abduction with GTB 2 x 10  Standing pec stretch at wall 2 x 20"  04/11/22 Eval     PATIENT EDUCATION:  Education details: on eval findings, POC and HEP  Person educated: Patient Education method: Explanation Education comprehension: verbalized understanding  HOME EXERCISE PROGRAM: Access Code: [email or web address] URL: [email or web address] 04/17/22 - Hooklying Clamshell with Resistance  - 2-3 x daily - 7 x weekly - 2 sets - 10  reps - 5 second hold - Supine Hip Adduction Isometric with Ball  - 2-3 x daily - 7 x weekly - 2 sets - 10 reps - 5 second hold - Single Arm Doorway Pec Stretch at 90 Degrees Abduction  - 2-3 x daily - 7 x weekly - 1 sets - 3 reps - 30 second hold - Shoulder extension with resistance - Neutral  - 2-3 x daily - 7 x weekly - 2 sets - 10 reps - 5 second hold - Standing Shoulder Horizontal Abduction with Resistance  - 2-3 x daily - 7 x weekly - 2 sets - 10 reps - 5 second hold  Date: 04/11/2022 Prepared by: Josue Hector  Exercises - Supine Transversus Abdominis Bracing - Hands on Stomach  - 2-3 x daily - 7 x weekly - 2 sets - 10 reps  - 5 sec hold - Supine March  - 2-3 x daily - 7 x weekly - 2 sets - 10 reps - Supine Bridge  - 2-3 x daily - 7 x weekly - 2 sets - 10 reps - 5 sec hold - Sidelying Hip Abduction  - 2-3 x daily - 7 x weekly - 2 sets - 10 reps  ASSESSMENT:  CLINICAL IMPRESSION: Began session discussing dynamic vs static stretches prior gym daily.  Session focus with core, proximal and postural strengthening.  Min verbal and tactile cueing to improve awareness of pelvic alignment prior movement for maximal core strengthening.   OBJECTIVE IMPAIRMENTS: decreased activity tolerance, decreased strength, increased fascial restrictions, improper body mechanics, postural dysfunction, and pain.   ACTIVITY LIMITATIONS: carrying, lifting, bending, standing, and squatting  PARTICIPATION LIMITATIONS: meal prep, cleaning, laundry, driving, shopping, community activity, occupation, and yard work  PERSONAL FACTORS:  none  are also affecting patient's functional outcome.   REHAB POTENTIAL: Excellent  CLINICAL DECISION MAKING: Stable/uncomplicated  EVALUATION COMPLEXITY: Low   GOALS: SHORT TERM GOALS: Target date: 05/02/2022  Patient will be independent with initial HEP and self-management strategies to improve functional outcomes Baseline:  Goal status: IN PROGRESS   LONG TERM GOALS: Target date: 05/23/2022  Patient will be independent with advanced HEP and self-management strategies to improve functional outcomes Baseline:  Goal status: IN PROGRESS  2.  Patient will improve Oswestry score by at least 5% to indicate improvement in functional outcomes Baseline: 12% disability Goal status: IN PROGRESS  3.  Patient will report reduction of back pain to <3/10 with activity for improved quality of life and ability to perform ADLs  Baseline: 4-5 Goal status: IN PROGRESS  4. Patient will have 5/5 MMT throughout BLE to improve ability to perform functional mobility, stair ambulation and ADLs.  Baseline: See  MMT Goal status: IN PROGRESS PLAN:  PT FREQUENCY: 1-2x/week  PT DURATION: 6 weeks  PLANNED INTERVENTIONS: Therapeutic exercises, Therapeutic activity, Neuromuscular re-education, Balance training, Gait training, Patient/Family education, Joint manipulation, Joint mobilization, Stair training, Aquatic Therapy, Dry Needling, Electrical stimulation, Spinal manipulation, Spinal mobilization, Cryotherapy, Moist heat, scar mobilization, Taping, Traction, Ultrasound, Biofeedback, Ionotophoresis '4mg'$ /ml Dexamethasone, and Manual therapy. Marland Kitchen  PLAN FOR NEXT SESSION: Progress glute and core strength with focus on improved posturing.  Review goals next session for medicaid.    Ihor Austin, LPTA/CLT; CBIS [phone]  12:12 PM, 05/15/22

## 2022-05-20 ENCOUNTER — Encounter (HOSPITAL_COMMUNITY): Payer: Medicaid Other | Admitting: Physical Therapy

## 2022-05-21 ENCOUNTER — Encounter (HOSPITAL_COMMUNITY): Payer: Medicaid Other

## 2022-05-22 ENCOUNTER — Ambulatory Visit (HOSPITAL_COMMUNITY): Payer: Medicaid Other | Admitting: Physical Therapy

## 2022-05-22 DIAGNOSIS — M5459 Other low back pain: Secondary | ICD-10-CM

## 2022-05-22 DIAGNOSIS — M549 Dorsalgia, unspecified: Secondary | ICD-10-CM

## 2022-05-22 NOTE — Therapy (Signed)
OUTPATIENT PHYSICAL THERAPY THORACOLUMBAR TREATMENT   Patient Name: Michelle [name] MRN: [id number] DOB:Jun 19, 2000, 22 y.o., female Today's Date: 05/22/2022  PHYSICAL THERAPY DISCHARGE SUMMARY  Visits from Start of Care: 4  Current functional level related to goals / functional outcomes: See below    Remaining deficits: See below    Education / Equipment: See assessment    Patient agrees to discharge. Patient goals were partially met. Patient is being discharged due to maximized rehab potential.   END OF SESSION:  PT End of Session - 05/22/22 1034     Visit Number 5    Number of Visits 12    Date for PT Re-Evaluation 05/23/22    Authorization Type Medicaid healthy Blue    Authorization Time Period 4 visits approved 1/30-1/11/23    Authorization - Visit Number 4    Authorization - Number of Visits 4    Progress Note Due on Visit 10    PT Start Time 3614    PT Stop Time 1110    PT Time Calculation (min) 38 min    Activity Tolerance Patient tolerated treatment well    Behavior During Therapy WFL for tasks assessed/performed              No past medical history on file. No past surgical history on file. Patient Active Problem List   Diagnosis Date Noted   Morbid obesity ([place]) 02/20/2020   Hyperinsulinemia 01/02/2019   Acanthosis nigricans 01/01/2019   Anxiety 01/01/2019   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   PMDD (premenstrual dysphoric disorder) 01/24/2017    PCP: Marena Chancy   REFERRING PROVIDER: Camillia Herter, MD  REFERRING DIAG: N62 (ICD-10-CM) - Macromastia  Rationale for Evaluation and Treatment: Rehabilitation  THERAPY DIAG:  Other low back pain  Mid back pain  ONSET DATE: Chronic  SUBJECTIVE:                                                                                                                                                                                           SUBJECTIVE STATEMENT: Patient states she  feels her pain and chief complaint is essentially the same. She does feel that she has learned some things in therapy and her awareness, as far as body mechanics, has improved.    Eval: Patient presets with ongoing complaint of mid and low back pain. She is consulting with surgeon for breast reduction surgery but must go through therapy first. She does work out regularly but continues to have ongoing pain in mid back and lumbar with prolonged activity. She also has intermittent bout of sciatica.   PERTINENT HISTORY:  NA  PAIN:  Are you having pain? Yes: NPRS scale: 4/10 Pain location: mid-low back  Pain description: sore, constant Aggravating factors: prolonged activity, bending, lifting  Relieving factors: rest, stretching   PRECAUTIONS: None  WEIGHT BEARING RESTRICTIONS: No  FALLS:  Has patient fallen in last 6 months? No  LIVING ENVIRONMENT: Lives with: lives with their family Lives in: House/apartment  OCCUPATION: Oceanographer  PLOF: Independent  PATIENT GOALS: "Complete what I need to complete"  NEXT MD VISIT:   OBJECTIVE:   DIAGNOSTIC FINDINGS:  NA  PATIENT SURVEYS:  Modified Oswestry 20% (10/50); (was 12% (6/50)    COGNITION: Overall cognitive status: Within functional limits for tasks assessed     SENSATION: WFL  MUSCLE LENGTH: WFL  POSTURE: rounded shoulders  PALPATION: Mod TTP about bilateral lumbar and thoracic paraspinals   LUMBAR ROM:   AROM eval  Flexion Full  Extension Full  Right lateral flexion Full  Left lateral flexion Full  Right rotation   Left rotation    (Blank rows = not tested)  LOWER EXTREMITY ROM:    WNL   LOWER EXTREMITY MMT:    MMT Right eval Left eval Right 05/22/22 Left 05/22/22  Hip flexion 4+ 4+ 5 5  Hip extension 4 4 4+ 4+  Hip abduction 4 4 4+ 5  Hip adduction      Hip internal rotation      Hip external rotation      Knee flexion      Knee extension '4 4 5 5  '$ Ankle dorsiflexion 4+ '5 5 5   '$ Ankle plantarflexion      Ankle inversion      Ankle eversion       (Blank rows = not tested)  LUMBAR SPECIAL TESTS:  (-) SLR, (+) Instability   GAIT:   TODAY'S TREATMENT:                                                                                                                              DATE:  05/22/22 Reassess Mod Oswestry MMT HEP review Assist with MyChart set up    05/15/22: Discussed dynamic vs static stretches before and after exercise. Sumo squat with 10# 8 x Prone: Row 5# 10x 2 sets 5" Shoulder extension 10x 5" Seated: anterior/posterior rotation with tactile cueing to improve neutral prior seated postural strengthening. Wback 5# 2x 10 with ab set Quadruped:  UE/LE 10x 5" 2# bar across lower back  05/07/22: Quadruped:  UE/LE 10x 5" 2# bar across lower back Prone: (pillow under ASIS) Row 3# then 5# 10x 5" 2 sets Plank 3x10" Supine: dead bug 20x bridge  Seated anterior/posterior rotation (increase pressure with palpation PIIS)  04/17/22 Ab brace 10 x 5" Ab march x 20  SLR with ab brace x 20 each  Dead bug 2 x 10 Bridge x20  Hip abduction iso 10 x 5" GTB  Hip adduction iso 10 x 5"  Band row GTB 2 x  10 Band shoulder extension  GTB 2 x 10 Horizontal shoulder abduction with GTB 2 x 10  Standing pec stretch at wall 2 x 20"  04/11/22 Eval     PATIENT EDUCATION:  Education details: on eval findings, POC and HEP  Person educated: Patient Education method: Explanation Education comprehension: verbalized understanding  HOME EXERCISE PROGRAM: Access Code: [email or web address] URL: [email or web address]  05/22/22 Access Code: [email or web address] URL: [email or web address] Date: 05/22/2022 Prepared by: Josue Hector  Exercises - Supine Transversus Abdominis Bracing - Hands on Stomach  - 2 x daily - 7 x weekly - 2 sets - 10 reps - 5 sec hold - Supine March  - 2 x daily - 7 x weekly - 2 sets - 10 reps - Supine Bridge  - 2 x daily -  7 x weekly - 2 sets - 10 reps - 5 sec hold - Sidelying Hip Abduction  - 2 x daily - 7 x weekly - 2 sets - 10 reps - Hooklying Clamshell with Resistance  - 2 x daily - 7 x weekly - 2 sets - 10 reps - 5 second hold - Supine Hip Adduction Isometric with Ball  - 2 x daily - 7 x weekly - 2 sets - 10 reps - 5 second hold - Single Arm Doorway Pec Stretch at 90 Degrees Abduction  - 2 x daily - 7 x weekly - 1 sets - 3 reps - 30 second hold - Shoulder extension with resistance - Neutral  - 1-2 x daily - 4-5 x weekly - 2 sets - 10 reps - 5 second hold - Standing Shoulder Horizontal Abduction with Resistance  - 1-2 x daily - 4-5 x weekly - 2 sets - 10 reps - 5 second hold - Squat with Chair Touch  - 1-2 x daily - 4-5 x weekly - 2 sets - 10 reps - Bird Dog  - 1-2 x daily - 4-5 x weekly - 2 sets - 10 reps - Standard Plank  - 1-2 x daily - 4-5 x weekly - 1 sets - 3 reps - 15-20 second hold  04/17/22 - Hooklying Clamshell with Resistance  - 2-3 x daily - 7 x weekly - 2 sets - 10 reps - 5 second hold - Supine Hip Adduction Isometric with Ball  - 2-3 x daily - 7 x weekly - 2 sets - 10 reps - 5 second hold - Single Arm Doorway Pec Stretch at 90 Degrees Abduction  - 2-3 x daily - 7 x weekly - 1 sets - 3 reps - 30 second hold - Shoulder extension with resistance - Neutral  - 2-3 x daily - 7 x weekly - 2 sets - 10 reps - 5 second hold - Standing Shoulder Horizontal Abduction with Resistance  - 2-3 x daily - 7 x weekly - 2 sets - 10 reps - 5 second hold  Date: 04/11/2022 Prepared by: Josue Hector  Exercises - Supine Transversus Abdominis Bracing - Hands on Stomach  - 2-3 x daily - 7 x weekly - 2 sets - 10 reps - 5 sec hold - Supine March  - 2-3 x daily - 7 x weekly - 2 sets - 10 reps - Supine Bridge  - 2-3 x daily - 7 x weekly - 2 sets - 10 reps - 5 sec hold - Sidelying Hip Abduction  - 2-3 x daily - 7 x weekly - 2 sets - 10 reps  ASSESSMENT:  CLINICAL IMPRESSION: Patient shows moderate progress to therapy  goals.  She has improved strength and postural awareness. Also demos improved body mechanics. Patient remains limited by pain with functional activity, especially prolonged activity. Also patient subjective improvement had decreased as evidences by modified Oswestry score. At this time, patient will be DC due to max benefit reached to return to referring provider for further assessment. Reviewed comprehensive HEP and answered all patient questions. Patient encouraged to follow up with therapy services with any further questions or concerns.    OBJECTIVE IMPAIRMENTS: decreased activity tolerance, decreased strength, increased fascial restrictions, improper body mechanics, postural dysfunction, and pain.   ACTIVITY LIMITATIONS: carrying, lifting, bending, standing, and squatting  PARTICIPATION LIMITATIONS: meal prep, cleaning, laundry, driving, shopping, community activity, occupation, and yard work  PERSONAL FACTORS:  none  are also affecting patient's functional outcome.   REHAB POTENTIAL: Excellent  CLINICAL DECISION MAKING: Stable/uncomplicated  EVALUATION COMPLEXITY: Low   GOALS: SHORT TERM GOALS: Target date: 05/02/2022  Patient will be independent with initial HEP and self-management strategies to improve functional outcomes Baseline:  Goal status: MET   LONG TERM GOALS: Target date: 05/23/2022  Patient will be independent with advanced HEP and self-management strategies to improve functional outcomes Baseline: Reviewed HEP and answered all questions Goal status: MET  2.  Patient will improve Oswestry score by at least 5% to indicate improvement in functional outcomes Baseline: 20% disability (decreased by 8%) Goal status: NOT MET  3.  Patient will report reduction of back pain to <3/10 with activity for improved quality of life and ability to perform ADLs  Baseline: 4 Goal status: NOT MET  4. Patient will have 5/5 MMT throughout BLE to improve ability to perform functional  mobility, stair ambulation and ADLs.  Baseline: See MMT Goal status: Partially MET  PLAN:  PT FREQUENCY: 1-2x/week  PT DURATION: 6 weeks  PLANNED INTERVENTIONS: Therapeutic exercises, Therapeutic activity, Neuromuscular re-education, Balance training, Gait training, Patient/Family education, Joint manipulation, Joint mobilization, Stair training, Aquatic Therapy, Dry Needling, Electrical stimulation, Spinal manipulation, Spinal mobilization, Cryotherapy, Moist heat, scar mobilization, Taping, Traction, Ultrasound, Biofeedback, Ionotophoresis '4mg'$ /ml Dexamethasone, and Manual therapy. Marland Kitchen  PLAN FOR NEXT SESSION: DC to HEP   11:03 AM, 05/22/22 Josue Hector PT DPT  Physical Therapist with [facility]  [phone]

## 2022-05-24 ENCOUNTER — Encounter: Payer: Self-pay | Admitting: Plastic Surgery

## 2022-05-27 ENCOUNTER — Telehealth (INDEPENDENT_AMBULATORY_CARE_PROVIDER_SITE_OTHER): Payer: Medicaid Other | Admitting: Physician Assistant

## 2022-05-27 DIAGNOSIS — N62 Hypertrophy of breast: Secondary | ICD-10-CM | POA: Diagnosis not present

## 2022-05-27 NOTE — Progress Notes (Addendum)
   Referring Provider No referring provider defined for this encounter.   CC:  Chief Complaint  Patient presents with   Breast Problem      Michelle [name] is an 22 y.o. female.  HPI: Patient is a 22 y.o. year old female here for follow up after completing physical therapy for pain related to macromastia.  She was seen for initial consult by Dr. Lovena Le on 03/07/2022.  Complains of chronic upper back and neck discomfort in the context of large breasts.  STN noted to be 39 cm each side.  Plan was for PT x 6 weeks to see if there is any considerable improvement in her symptoms of macromastia before proceeding with surgical intervention.  The patient, Michelle [name], gave permission to have this encounter performed via telemedicine, two identifiers were used to confirm patient's identity.  They also consented to chart review and treatment via this encounter.  The patient was at home and this provider was calling from their office.  A total of 12 minutes was spent speaking with the patient and reviewing chart.    She tells me that she has completed her physical therapy without any considerable improvement in her symptoms of macromastia.  She states that activities are severely hindered by her large breasts and that she has difficulty finding clothes that fit.  She reports that she is a 42 G cup.  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  She states that her rashes are refractory to medical management and continue to occur.  She has continued to try to alleviate her symptoms with weight loss, without any improvement despite 5 to 10 pound weight loss since last encounter.  She is hopeful that she can proceed with breast reduction surgery.  Review of Systems General: Denies fevers MSK: Endorses ongoing back and neck discomfort Skin: Endorses chronic rashes  Physical Exam    04/27/2022    9:20 AM  03/07/2022   11:10 AM 06/19/2021    1:16 PM  Vitals with BMI  Height  '5\' 3"'$    Weight  208 lbs   BMI  [id number]   Systolic 0000000 [id number]  Diastolic 74 78 86  Pulse 83 81 99    None due to telephone encounter.   Assessment/Plan  Patient is interested in pursuing surgical intervention for bilateral breast reduction. Patient has completed at least 6 weeks of physical therapy for pain related to macromastia.  Discussed with patient we would submit to insurance for authorization, discussed approval could take up to 6 weeks.   Krista Blue 05/27/2022, 4:15 PM

## 2022-07-15 ENCOUNTER — Telehealth: Payer: Self-pay

## 2022-07-15 NOTE — Telephone Encounter (Signed)
Uploaded to [facility]

## 2022-07-17 NOTE — Telephone Encounter (Signed)
Pt called checking status of insurance approval. Informed pt her case has been sent to [facility] and is still pending for their response.

## 2022-07-19 ENCOUNTER — Other Ambulatory Visit: Payer: Medicaid Other

## 2022-07-19 ENCOUNTER — Ambulatory Visit
Admission: EM | Admit: 2022-07-19 | Discharge: 2022-07-19 | Disposition: A | Discharge status: Home / Self Care | Payer: Medicaid Other | Attending: Family Medicine | Admitting: Family Medicine

## 2022-07-19 ENCOUNTER — Ambulatory Visit (INDEPENDENT_AMBULATORY_CARE_PROVIDER_SITE_OTHER): Payer: Medicaid Other

## 2022-07-19 DIAGNOSIS — M25572 Pain in left ankle and joints of left foot: Secondary | ICD-10-CM

## 2022-07-19 DIAGNOSIS — W172XXA Fall into hole, initial encounter: Secondary | ICD-10-CM

## 2022-07-19 NOTE — ED Provider Notes (Signed)
[facility]    CSN: [id number] Arrival date & time: 07/19/22  1841      History   Chief Complaint No chief complaint on file.   HPI Michelle [name] is a 22 y.o. female.   Patient presenting today with left lateral ankle pain, swelling that started today when she turned her ankle stepping into a hole in the grass.  States she is able to move the ankle but it is painful particularly with weightbearing.  Denies numbness, tingling, discoloration.  So far has tried a compression wrap but not trying anything else for symptoms.    History reviewed. No pertinent past medical history.  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 No pertinent surgical history.  OB History   No obstetric history on file.      Home Medications    Prior to Admission medications   Medication Sig Start Date End Date Taking? Authorizing Provider  fluticasone (FLONASE) 50 MCG/ACT nasal spray Place 1 spray into both nostrils 2 (two) times daily. 04/27/22   Volney American, PA-C  promethazine-dextromethorphan (PROMETHAZINE-DM) 6.25-15 MG/5ML syrup Take 5 mLs by mouth 4 (four) times daily as needed. 04/27/22   Volney American, PA-C  pseudoephedrine (SUDAFED 12 HOUR) 120 MG 12 hr tablet Take 1 tablet (120 mg total) by mouth every 12 (twelve) hours as needed for congestion. 04/27/22   Volney American, PA-C  sertraline (ZOLOFT) 50 MG tablet Take 1 tablet (50 mg total) by mouth daily. Patient not taking: Reported on 03/07/2022 06/19/21   Volney American, PA-C  Vitamin D, Ergocalciferol, (DRISDOL) 1.25 MG (50000 UNIT) CAPS capsule Take 1 capsule (50,000 Units total) by mouth every 7 (seven) days. Patient not taking:  Reported on 03/07/2022 02/29/20   Nilda Simmer, NP  metFORMIN (GLUCOPHAGE) 500 MG tablet Crush one tablet and take by mouth twice a day with foods 01/01/19 12/19/19  Nilda Simmer, NP    Family History History reviewed. No pertinent family history.  Social History Social History   Tobacco Use   Smoking status: Never   Smokeless tobacco: Never  Vaping Use   Vaping Use: Never used  Substance Use Topics   Alcohol use: Never   Drug use: Never     Allergies   Patient has no known allergies.   Review of Systems Review of Systems Per HPI  Physical Exam Triage Vital Signs ED Triage Vitals  Enc Vitals Group     BP 07/19/22 1907 131/76     Pulse Rate 07/19/22 1907 90     Resp 07/19/22 1907 20     Temp 07/19/22 1907 98.9 F (37.2 C)     Temp Source 07/19/22 1907 Oral     SpO2 07/19/22 1907 98 %     Weight --      Height --      Head Circumference --      Peak Flow --      Pain Score 07/19/22 1910 5     Pain Loc --      Pain Edu? --      Excl. in [place]? --    No data found.  Updated Vital Signs BP 131/76 (BP Location: Right Arm)   Pulse 90   Temp 98.9  F (37.2 C) (Oral)   Resp 20   LMP 06/26/2022   SpO2 98%   Visual Acuity Right Eye Distance:   Left Eye Distance:   Bilateral Distance:    Right Eye Near:   Left Eye Near:    Bilateral Near:     Physical Exam Vitals and nursing note reviewed.  Constitutional:      Appearance: Normal appearance. She is not ill-appearing.  HENT:     Head: Atraumatic.     Mouth/Throat:     Mouth: Mucous membranes are moist.  Eyes:     Extraocular Movements: Extraocular movements intact.     Conjunctiva/sclera: Conjunctivae normal.  Cardiovascular:     Rate and Rhythm: Normal rate and regular rhythm.     Heart sounds: Normal heart sounds.  Pulmonary:     Effort: Pulmonary effort is normal.     Breath sounds: Normal breath sounds.  Musculoskeletal:        General: Swelling, tenderness and signs of injury  present. No deformity. Normal range of motion.     Cervical back: Normal range of motion and neck supple.     Comments: Mild edema to the left lateral malleolus region, tender to palpation in this area, no bony deformity palpable.  Range of motion intact  Skin:    General: Skin is warm and dry.     Findings: No bruising or erythema.  Neurological:     Mental Status: She is alert and oriented to person, place, and time.     Motor: No weakness.     Gait: Gait normal.     Comments: Left lower extremity neurovascularly intact  Psychiatric:        Mood and Affect: Mood normal.        Thought Content: Thought content normal.        Judgment: Judgment normal.      UC Treatments / Results  Labs (all labs ordered are listed, but only abnormal results are displayed) Labs Reviewed - No data to display  EKG   Radiology DG Ankle Complete Left  Result Date: 07/19/2022 CLINICAL DATA:  Fall EXAM: LEFT ANKLE COMPLETE - 3+ VIEW COMPARISON:  None Available. FINDINGS: There is no evidence of fracture, dislocation, or joint effusion. There is no evidence of arthropathy or other focal bone abnormality. There is lateral soft tissue swelling. IMPRESSION: Lateral soft tissue swelling. No acute fracture or dislocation. Electronically Signed   By: Ronney Asters M.D.   On: 07/19/2022 19:28    Procedures Procedures (including critical care time)  Medications Ordered in UC Medications - No data to display  Initial Impression / Assessment and Plan / UC Course  I have reviewed the triage vital signs and the nursing notes.  Pertinent labs & imaging results that were available during my care of the patient were reviewed by me and considered in my medical decision making (see chart for details).     X-ray negative for acute bony abnormality, will apply a compression wrap today in clinic, discussed RICE protocol, over-the-counter pain relievers.  Work note given in case needed.  Return for worsening  symptoms.  Final Clinical Impressions(s) / UC Diagnoses   Final diagnoses:  Acute left ankle pain     Discharge Instructions      Ice, elevate, wear a compression wrap, ibuprofen and Tylenol as needed.  Your x-ray did not show any fractures today    ED Prescriptions   None    PDMP not reviewed this encounter.  Volney American, [place] 07/19/22 1956

## 2022-07-19 NOTE — Discharge Instructions (Signed)
Ice, elevate, wear a compression wrap, ibuprofen and Tylenol as needed.  Your x-ray did not show any fractures today

## 2022-07-19 NOTE — ED Triage Notes (Signed)
Pt reports she fell in a hole and injured her left ankle x 1 day She has had it wrapped up to provide relief. States it's very painful. Has limited ROM when moving to the left side.

## 2022-07-24 ENCOUNTER — Telehealth: Payer: Self-pay

## 2022-07-24 NOTE — Telephone Encounter (Signed)
[facility] denied due to patient didn't have breast cancer, injury, nor congential issues.  Faxed to [facility] [place] 3/8 [phone]

## 2022-07-24 NOTE — Telephone Encounter (Signed)
Called [employer] [place], spoke with Clint. Case is still pending [phone]

## 2022-08-01 ENCOUNTER — Telehealth: Payer: Self-pay

## 2022-08-01 NOTE — Telephone Encounter (Signed)
Emailed color photos to : [email or web address]

## 2022-08-13 ENCOUNTER — Telehealth: Payer: Self-pay

## 2022-08-13 NOTE — Telephone Encounter (Signed)
Called patient, advised surgery was authorized. Should hear from our surgery scheduler within a week to schedule surgery.    Gave file to [employer]

## 2022-08-16 ENCOUNTER — Telehealth: Payer: Self-pay | Admitting: *Deleted

## 2022-08-16 NOTE — Telephone Encounter (Signed)
Called patient to schedule surgery but she hung up when I asked for her. Sent MyChart message.

## 2022-08-16 NOTE — Telephone Encounter (Signed)
Spoke to patient to schedule sx and related appts. 

## 2022-08-21 ENCOUNTER — Ambulatory Visit (INDEPENDENT_AMBULATORY_CARE_PROVIDER_SITE_OTHER): Payer: BC Managed Care – PPO | Admitting: Student

## 2022-08-21 VITALS — BP 120/80 | HR 75 | Ht 62.5 in | Wt 202.0 lb

## 2022-08-21 DIAGNOSIS — N62 Hypertrophy of breast: Secondary | ICD-10-CM

## 2022-08-21 MED ORDER — OXYCODONE HCL 5 MG/5ML PO SOLN: 5.0000 mg | Freq: Four times a day (QID) | ORAL | 0 refills | Status: AC | PRN

## 2022-08-21 MED ORDER — ONDANSETRON 4 MG PO TBDP: 4.0000 mg | ORAL_TABLET | Freq: Three times a day (TID) | ORAL | 0 refills | Status: AC | PRN

## 2022-08-21 NOTE — Progress Notes (Signed)
Patient ID: Michelle [name], female    DOB: 12/14/2000, 22 y.o.   MRN: [id number]  Chief Complaint  Patient presents with   Pre-op Exam      ICD-10-CM   1. Macromastia  N62        History of Present Illness: Michelle [name] is a 22 y.o.  female  with a history of macromastia.  She presents for preoperative evaluation for upcoming procedure, Bilateral Breast Reduction, scheduled for 09/10/22 with Dr.  Ladona Ridgelaylor  The patient is accompanied by her mother at bedside today.  Patient states that she has had her wisdom teeth removed in the past, and did not have any issues with the anesthesia at that time.  Patient states that she has never had to get a mammogram before.  She denies any personal family history of breast cancer.  She denies any history of cardiac disease.  She states that she is not a smoker.  Patient denies being on any birth control or hormone replacement.  She denies any history of miscarriages.  She denies any personal or family history of blood clots or clotting diseases.  She denies any recent traumas, hospitalizations, pregnancies or infections.  She denies any history of stroke or heart attack.  She denies any history of Crohn's disease or ulcerative colitis.  She denies any history of COPD or asthma.  She denies any history of cancer.  She denies any varicosities on her lower extremities.  She denies any recent fevers, chills or changes in her health.  Patient states that she currently is a 42 G cup.  She states that she would like to be a D or a DD cup.  I discussed with the patient that cup size cannot be guaranteed and I discussed the limitations of surgery and how much tissue can be removed at the time of surgery.  Patient expressed understanding.  Summary of Previous Visit: Patient was seen for consult on 03/07/2022.  At this visit, patient reported history of back pain and difficulty with upright posture, as well as difficulty with running and physical activity  due to her enlarged breast.  She also reported persistent rashes between her breasts due to the large size of her breasts.  Patient requested reduction in the size of her breasts.  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  Her STN on the right was 39 cm and her STN on the left was 39 cm.  Risks of the procedure were discussed with the patient. All of the patient's questions were answered and plan was to submit her for breast reduction.   Estimated excess breast tissue to be removed at time of surgery: 700 grams bilaterally  Job: Building surveyorDaycare teacher, planning to take 1 week off.  PMH Significant for: Macromastia, anxiety  Patient states that she does not regularly take any medications at this time.   Past Medical History: Allergies: No Known Allergies  Current Medications:  Current Outpatient Medications:    fluticasone (FLONASE) 50 MCG/ACT nasal spray, Place 1 spray into both nostrils 2 (two) times daily., Disp: 16 g, Rfl: 2   promethazine-dextromethorphan (PROMETHAZINE-DM) 6.25-15 MG/5ML syrup, Take 5 mLs by mouth 4 (four) times daily as needed., Disp: 100 mL, Rfl: 0   pseudoephedrine (SUDAFED 12 HOUR) 120 MG 12 hr tablet, Take 1 tablet (120 mg total) by mouth every 12 (twelve) hours as needed for congestion., Disp: 20 tablet, Rfl: 0   sertraline (ZOLOFT) 50 MG tablet, Take 1 tablet (50 mg  total) by mouth daily., Disp: 30 tablet, Rfl: 1   Vitamin D, Ergocalciferol, (DRISDOL) 1.25 MG (50000 UNIT) CAPS capsule, Take 1 capsule (50,000 Units total) by mouth every 7 (seven) days., Disp: 4 capsule, Rfl: 2  Past Medical Problems: No past medical history on file.  Past Surgical History: No past surgical history on file.  Social History: Social History   Socioeconomic History   Marital status: Single    Spouse name: Not on file   Number of children: Not on file   Years of education: Not on file   Highest education level: Not on file  Occupational History   Not on file   Tobacco Use   Smoking status: Never   Smokeless tobacco: Never  Vaping Use   Vaping Use: Never used  Substance and Sexual Activity   Alcohol use: Never   Drug use: Never   Sexual activity: Not Currently    Birth control/protection: None  Other Topics Concern   Not on file  Social History Narrative   Not on file   Social Determinants of Health   Financial Resource Strain: Not on file  Food Insecurity: Not on file  Transportation Needs: Not on file  Physical Activity: Not on file  Stress: Not on file  Social Connections: Not on file  Intimate Partner Violence: Not on file    Family History: No family history on file.  Review of Systems: Denies any fevers, chills or recent changes in her health  Physical Exam: Vital Signs BP 120/80 (BP Location: Left Arm, Patient Position: Sitting, Cuff Size: Large)   Pulse 75   Ht 5' 2.5" (1.588 m)   Wt 202 lb (91.6 kg)   SpO2 100%   BMI 36.36 kg/m   Physical Exam  Constitutional:      General: Not in acute distress.    Appearance: Normal appearance. Not ill-appearing.  HENT:     Head: Normocephalic and atraumatic.  Neck:     Musculoskeletal: Normal range of motion.  Cardiovascular:     Rate and Rhythm: Normal rate Pulmonary:     Effort: Pulmonary effort is normal. No respiratory distress.  Musculoskeletal: Normal range of motion.  Skin:    General: Skin is warm and dry.     Findings: No erythema or rash.  Neurological:     Mental Status: Alert and oriented to person, place, and time. Mental status is at baseline.  Psychiatric:        Mood and Affect: Mood normal.        Behavior: Behavior normal.    Assessment/Plan: The patient is scheduled for bilateral breast reduction with Dr. Ladona Ridgel.  Risks, benefits, and alternatives of procedure discussed, questions answered and consent obtained.    Smoking Status: Non-smoker; Counseling Given?  N/A Last Mammogram: N/A due to age; Results: N/A  Caprini Score 3 ; Risk  Factors include: BMI > 25 and length of planned surgery. Recommendation for mechanical prophylaxis. Encourage early ambulation.   Pictures obtained: @consult   Post-op Rx sent to pharmacy: Oxycodone, Zofran -patient requesting oxycodone solution and dissolvable Zofran as patient states she cannot swallow pills.  I instructed the patient to hold any multivitamins, supplements, herbal teas or ibuprofen a week before surgery.  Patient expressed understanding.  Patient was provided with the breast reduction and General Surgical Risk consent document and Pain Medication Agreement prior to their appointment.  They had adequate time to read through the risk consent documents and Pain Medication Agreement. We also discussed them in  person together during this preop appointment. All of their questions were answered to their satisfaction.  Recommended calling if they have any further questions.  Risk consent form and Pain Medication Agreement to be scanned into patient's chart.  The risk that can be encountered with breast reduction were discussed and include the following but not limited to these:  Breast asymmetry, fluid accumulation, firmness of the breast, inability to breast feed, loss of nipple or areola, skin loss, decrease or no nipple sensation, fat necrosis of the breast tissue, bleeding, infection, healing delay.  There are risks of anesthesia, changes to skin sensation and injury to nerves or blood vessels.  The muscle can be temporarily or permanently injured.  You may have an allergic reaction to tape, suture, glue, blood products which can result in skin discoloration, swelling, pain, skin lesions, poor healing.  Any of these can lead to the need for revisonal surgery or stage procedures.  A reduction has potential to interfere with diagnostic procedures.  Nipple or breast piercing can increase risks of infection.  This procedure is best done when the breast is fully developed.  Changes in the breast  will continue to occur over time.  Pregnancy can alter the outcomes of previous breast reduction surgery, weight gain and weigh loss can also effect the long term appearance.     Electronically signed by: Laurena Spies, PA-C 08/21/2022 12:47 PM

## 2022-08-21 NOTE — H&P (View-Only) (Signed)
   Patient ID: Michelle [name], female    DOB: 02/14/2001, 21 y.o.   MRN: [id number]  Chief Complaint  Patient presents with   Pre-op Exam      ICD-10-CM   1. Macromastia  N62        History of Present Illness: Michelle [name] is a 21 y.o.  female  with a history of macromastia.  She presents for preoperative evaluation for upcoming procedure, Bilateral Breast Reduction, scheduled for 09/10/22 with Dr.  Taylor  The patient is accompanied by her mother at bedside today.  Patient states that she has had her wisdom teeth removed in the past, and did not have any issues with the anesthesia at that time.  Patient states that she has never had to get a mammogram before.  She denies any personal family history of breast cancer.  She denies any history of cardiac disease.  She states that she is not a smoker.  Patient denies being on any birth control or hormone replacement.  She denies any history of miscarriages.  She denies any personal or family history of blood clots or clotting diseases.  She denies any recent traumas, hospitalizations, pregnancies or infections.  She denies any history of stroke or heart attack.  She denies any history of Crohn's disease or ulcerative colitis.  She denies any history of COPD or asthma.  She denies any history of cancer.  She denies any varicosities on her lower extremities.  She denies any recent fevers, chills or changes in her health.  Patient states that she currently is a 42 G cup.  She states that she would like to be a D or a DD cup.  I discussed with the patient that cup size cannot be guaranteed and I discussed the limitations of surgery and how much tissue can be removed at the time of surgery.  Patient expressed understanding.  Summary of Previous Visit: Patient was seen for consult on 03/07/2022.  At this visit, patient reported history of back pain and difficulty with upright posture, as well as difficulty with running and physical activity  due to her enlarged breast.  She also reported persistent rashes between her breasts due to the large size of her breasts.  Patient requested reduction in the size of her breasts.  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  Her STN on the right was 39 cm and her STN on the left was 39 cm.  Risks of the procedure were discussed with the patient. All of the patient's questions were answered and plan was to submit her for breast reduction.   Estimated excess breast tissue to be removed at time of surgery: 700 grams bilaterally  Job: Daycare teacher, planning to take 1 week off.  PMH Significant for: Macromastia, anxiety  Patient states that she does not regularly take any medications at this time.   Past Medical History: Allergies: No Known Allergies  Current Medications:  Current Outpatient Medications:    fluticasone (FLONASE) 50 MCG/ACT nasal spray, Place 1 spray into both nostrils 2 (two) times daily., Disp: 16 g, Rfl: 2   promethazine-dextromethorphan (PROMETHAZINE-DM) 6.25-15 MG/5ML syrup, Take 5 mLs by mouth 4 (four) times daily as needed., Disp: 100 mL, Rfl: 0   pseudoephedrine (SUDAFED 12 HOUR) 120 MG 12 hr tablet, Take 1 tablet (120 mg total) by mouth every 12 (twelve) hours as needed for congestion., Disp: 20 tablet, Rfl: 0   sertraline (ZOLOFT) 50 MG tablet, Take 1 tablet (50 mg   total) by mouth daily., Disp: 30 tablet, Rfl: 1   Vitamin D, Ergocalciferol, (DRISDOL) 1.25 MG (50000 UNIT) CAPS capsule, Take 1 capsule (50,000 Units total) by mouth every 7 (seven) days., Disp: 4 capsule, Rfl: 2  Past Medical Problems: No past medical history on file.  Past Surgical History: No past surgical history on file.  Social History: Social History   Socioeconomic History   Marital status: Single    Spouse name: Not on file   Number of children: Not on file   Years of education: Not on file   Highest education level: Not on file  Occupational History   Not on file   Tobacco Use   Smoking status: Never   Smokeless tobacco: Never  Vaping Use   Vaping Use: Never used  Substance and Sexual Activity   Alcohol use: Never   Drug use: Never   Sexual activity: Not Currently    Birth control/protection: None  Other Topics Concern   Not on file  Social History Narrative   Not on file   Social Determinants of Health   Financial Resource Strain: Not on file  Food Insecurity: Not on file  Transportation Needs: Not on file  Physical Activity: Not on file  Stress: Not on file  Social Connections: Not on file  Intimate Partner Violence: Not on file    Family History: No family history on file.  Review of Systems: Denies any fevers, chills or recent changes in her health  Physical Exam: Vital Signs BP 120/80 (BP Location: Left Arm, Patient Position: Sitting, Cuff Size: Large)   Pulse 75   Ht 5' 2.5" (1.588 m)   Wt 202 lb (91.6 kg)   SpO2 100%   BMI 36.36 kg/m   Physical Exam  Constitutional:      General: Not in acute distress.    Appearance: Normal appearance. Not ill-appearing.  HENT:     Head: Normocephalic and atraumatic.  Neck:     Musculoskeletal: Normal range of motion.  Cardiovascular:     Rate and Rhythm: Normal rate Pulmonary:     Effort: Pulmonary effort is normal. No respiratory distress.  Musculoskeletal: Normal range of motion.  Skin:    General: Skin is warm and dry.     Findings: No erythema or rash.  Neurological:     Mental Status: Alert and oriented to person, place, and time. Mental status is at baseline.  Psychiatric:        Mood and Affect: Mood normal.        Behavior: Behavior normal.    Assessment/Plan: The patient is scheduled for bilateral breast reduction with Dr. Taylor.  Risks, benefits, and alternatives of procedure discussed, questions answered and consent obtained.    Smoking Status: Non-smoker; Counseling Given?  N/A Last Mammogram: N/A due to age; Results: N/A  Caprini Score 3 ; Risk  Factors include: BMI > 25 and length of planned surgery. Recommendation for mechanical prophylaxis. Encourage early ambulation.   Pictures obtained: @consult  Post-op Rx sent to pharmacy: Oxycodone, Zofran -patient requesting oxycodone solution and dissolvable Zofran as patient states she cannot swallow pills.  I instructed the patient to hold any multivitamins, supplements, herbal teas or ibuprofen a week before surgery.  Patient expressed understanding.  Patient was provided with the breast reduction and General Surgical Risk consent document and Pain Medication Agreement prior to their appointment.  They had adequate time to read through the risk consent documents and Pain Medication Agreement. We also discussed them in   person together during this preop appointment. All of their questions were answered to their satisfaction.  Recommended calling if they have any further questions.  Risk consent form and Pain Medication Agreement to be scanned into patient's chart.  The risk that can be encountered with breast reduction were discussed and include the following but not limited to these:  Breast asymmetry, fluid accumulation, firmness of the breast, inability to breast feed, loss of nipple or areola, skin loss, decrease or no nipple sensation, fat necrosis of the breast tissue, bleeding, infection, healing delay.  There are risks of anesthesia, changes to skin sensation and injury to nerves or blood vessels.  The muscle can be temporarily or permanently injured.  You may have an allergic reaction to tape, suture, glue, blood products which can result in skin discoloration, swelling, pain, skin lesions, poor healing.  Any of these can lead to the need for revisonal surgery or stage procedures.  A reduction has potential to interfere with diagnostic procedures.  Nipple or breast piercing can increase risks of infection.  This procedure is best done when the breast is fully developed.  Changes in the breast  will continue to occur over time.  Pregnancy can alter the outcomes of previous breast reduction surgery, weight gain and weigh loss can also effect the long term appearance.     Electronically signed by: Nolon Yellin E Hollyann Pablo, PA-C 08/21/2022 12:47 PM  

## 2022-09-02 ENCOUNTER — Encounter (HOSPITAL_BASED_OUTPATIENT_CLINIC_OR_DEPARTMENT_OTHER): Payer: Self-pay | Admitting: Plastic Surgery

## 2022-09-02 ENCOUNTER — Other Ambulatory Visit: Payer: Self-pay

## 2022-09-10 ENCOUNTER — Other Ambulatory Visit: Payer: Self-pay

## 2022-09-10 ENCOUNTER — Encounter (HOSPITAL_BASED_OUTPATIENT_CLINIC_OR_DEPARTMENT_OTHER): Payer: Self-pay | Admitting: Plastic Surgery

## 2022-09-10 ENCOUNTER — Ambulatory Visit (HOSPITAL_BASED_OUTPATIENT_CLINIC_OR_DEPARTMENT_OTHER): Payer: BC Managed Care – PPO | Admitting: Anesthesiology

## 2022-09-10 ENCOUNTER — Encounter (HOSPITAL_BASED_OUTPATIENT_CLINIC_OR_DEPARTMENT_OTHER)
Admission: RE | Disposition: A | Discharge status: Home / Self Care | Payer: Self-pay | Source: Home / Self Care | Attending: Plastic Surgery

## 2022-09-10 ENCOUNTER — Ambulatory Visit (HOSPITAL_BASED_OUTPATIENT_CLINIC_OR_DEPARTMENT_OTHER)
Admission: RE | Admit: 2022-09-10 | Discharge: 2022-09-10 | Disposition: A | Discharge status: Home / Self Care | Payer: BC Managed Care – PPO | Attending: Plastic Surgery | Admitting: Plastic Surgery

## 2022-09-10 DIAGNOSIS — N6021 Fibroadenosis of right breast: Secondary | ICD-10-CM | POA: Insufficient documentation

## 2022-09-10 DIAGNOSIS — N62 Hypertrophy of breast: Secondary | ICD-10-CM | POA: Insufficient documentation

## 2022-09-10 DIAGNOSIS — N6022 Fibroadenosis of left breast: Secondary | ICD-10-CM | POA: Diagnosis not present

## 2022-09-10 DIAGNOSIS — F419 Anxiety disorder, unspecified: Secondary | ICD-10-CM | POA: Diagnosis not present

## 2022-09-10 DIAGNOSIS — M549 Dorsalgia, unspecified: Secondary | ICD-10-CM | POA: Insufficient documentation

## 2022-09-10 DIAGNOSIS — N6459 Other signs and symptoms in breast: Secondary | ICD-10-CM | POA: Insufficient documentation

## 2022-09-10 DIAGNOSIS — R21 Rash and other nonspecific skin eruption: Secondary | ICD-10-CM | POA: Diagnosis not present

## 2022-09-10 HISTORY — DX: Allergy status to unspecified drugs, medicaments and biological substances: Z88.9

## 2022-09-10 HISTORY — DX: Anxiety disorder, unspecified: F41.9

## 2022-09-10 HISTORY — DX: Depression, unspecified: F32.A

## 2022-09-10 HISTORY — PX: BREAST REDUCTION SURGERY: SHX8

## 2022-09-10 LAB — POCT PREGNANCY, URINE: Preg Test, Ur: NEGATIVE

## 2022-09-10 SURGERY — MAMMOPLASTY, REDUCTION
Anesthesia: General | Site: Breast | Laterality: Bilateral

## 2022-09-10 MED ORDER — CHLORHEXIDINE GLUCONATE CLOTH 2 % EX PADS: 6.0000 | MEDICATED_PAD | Freq: Once | CUTANEOUS | Status: DC

## 2022-09-10 MED ORDER — OXYCODONE HCL 5 MG/5ML PO SOLN: 5.0000 mg | Freq: Once | ORAL | Status: DC

## 2022-09-10 MED ORDER — ACETAMINOPHEN 500 MG PO TABS: 1000.0000 mg | ORAL_TABLET | Freq: Once | ORAL | Status: DC

## 2022-09-10 MED ORDER — LIDOCAINE 2% (20 MG/ML) 5 ML SYRINGE
INTRAMUSCULAR | Status: AC
  Filled 2022-09-10: qty 5

## 2022-09-10 MED ORDER — CEFAZOLIN SODIUM-DEXTROSE 2-4 GM/100ML-% IV SOLN
2.0000 g | INTRAVENOUS | Status: AC
  Administered 2022-09-10: 2 g via INTRAVENOUS

## 2022-09-10 MED ORDER — LIDOCAINE HCL (CARDIAC) PF 100 MG/5ML IV SOSY
PREFILLED_SYRINGE | INTRAVENOUS | Status: DC | PRN
  Administered 2022-09-10: 100 mg via INTRAVENOUS

## 2022-09-10 MED ORDER — DEXMEDETOMIDINE HCL IN NACL 80 MCG/20ML IV SOLN
INTRAVENOUS | Status: DC | PRN
  Administered 2022-09-10: 12 ug via INTRAVENOUS
  Administered 2022-09-10: 300 ug via INTRAVENOUS
  Administered 2022-09-10: 16 ug via INTRAVENOUS

## 2022-09-10 MED ORDER — EPHEDRINE 5 MG/ML INJ
INTRAVENOUS | Status: AC
  Filled 2022-09-10: qty 5

## 2022-09-10 MED ORDER — HYDROMORPHONE HCL 1 MG/ML IJ SOLN
INTRAMUSCULAR | Status: AC
  Filled 2022-09-10: qty 1

## 2022-09-10 MED ORDER — ALBUTEROL SULFATE HFA 108 (90 BASE) MCG/ACT IN AERS
INHALATION_SPRAY | RESPIRATORY_TRACT | Status: AC
  Filled 2022-09-10: qty 6.7

## 2022-09-10 MED ORDER — DEXMEDETOMIDINE HCL IN NACL 80 MCG/20ML IV SOLN
INTRAVENOUS | Status: AC
  Filled 2022-09-10: qty 20

## 2022-09-10 MED ORDER — MIDAZOLAM HCL 2 MG/2ML IJ SOLN
INTRAMUSCULAR | Status: AC
  Filled 2022-09-10: qty 2

## 2022-09-10 MED ORDER — PHENYLEPHRINE 80 MCG/ML (10ML) SYRINGE FOR IV PUSH (FOR BLOOD PRESSURE SUPPORT)
PREFILLED_SYRINGE | INTRAVENOUS | Status: AC
  Filled 2022-09-10: qty 10

## 2022-09-10 MED ORDER — SUGAMMADEX SODIUM 200 MG/2ML IV SOLN
INTRAVENOUS | Status: DC | PRN
  Administered 2022-09-10: 200 mg via INTRAVENOUS

## 2022-09-10 MED ORDER — SUCCINYLCHOLINE CHLORIDE 200 MG/10ML IV SOSY
PREFILLED_SYRINGE | INTRAVENOUS | Status: AC
  Filled 2022-09-10: qty 10

## 2022-09-10 MED ORDER — LACTATED RINGERS IV SOLN: INTRAVENOUS | Status: DC | PRN

## 2022-09-10 MED ORDER — HYDROMORPHONE HCL 1 MG/ML IJ SOLN
INTRAMUSCULAR | Status: DC | PRN
  Administered 2022-09-10 (×2): .5 mg via INTRAVENOUS

## 2022-09-10 MED ORDER — FENTANYL CITRATE (PF) 100 MCG/2ML IJ SOLN
INTRAMUSCULAR | Status: AC
  Filled 2022-09-10: qty 2

## 2022-09-10 MED ORDER — DEXAMETHASONE SODIUM PHOSPHATE 4 MG/ML IJ SOLN
INTRAMUSCULAR | Status: DC | PRN
  Administered 2022-09-10: 10 mg via INTRAVENOUS

## 2022-09-10 MED ORDER — ROCURONIUM BROMIDE 100 MG/10ML IV SOLN
INTRAVENOUS | Status: DC | PRN
  Administered 2022-09-10: 60 mg via INTRAVENOUS
  Administered 2022-09-10: 20 mg via INTRAVENOUS

## 2022-09-10 MED ORDER — ONDANSETRON HCL 4 MG/2ML IJ SOLN
INTRAMUSCULAR | Status: DC | PRN
  Administered 2022-09-10: 4 mg via INTRAVENOUS

## 2022-09-10 MED ORDER — MIDAZOLAM HCL 5 MG/5ML IJ SOLN
INTRAMUSCULAR | Status: DC | PRN
  Administered 2022-09-10: 2 mg via INTRAVENOUS

## 2022-09-10 MED ORDER — 0.9 % SODIUM CHLORIDE (POUR BTL) OPTIME
TOPICAL | Status: DC | PRN
  Administered 2022-09-10: 1000 mL

## 2022-09-10 MED ORDER — KETOROLAC TROMETHAMINE 30 MG/ML IJ SOLN
30.0000 mg | Freq: Once | INTRAMUSCULAR | Status: AC
  Administered 2022-09-10: 30 mg via INTRAVENOUS

## 2022-09-10 MED ORDER — DEXAMETHASONE SODIUM PHOSPHATE 10 MG/ML IJ SOLN
INTRAMUSCULAR | Status: AC
  Filled 2022-09-10: qty 1

## 2022-09-10 MED ORDER — ACETAMINOPHEN 10 MG/ML IV SOLN
INTRAVENOUS | Status: DC | PRN
  Administered 2022-09-10: 1000 mg via INTRAVENOUS

## 2022-09-10 MED ORDER — ONDANSETRON HCL 4 MG/2ML IJ SOLN
INTRAMUSCULAR | Status: AC
  Filled 2022-09-10: qty 2

## 2022-09-10 MED ORDER — ACETAMINOPHEN 10 MG/ML IV SOLN
INTRAVENOUS | Status: AC
  Filled 2022-09-10: qty 100

## 2022-09-10 MED ORDER — ACETAMINOPHEN 500 MG PO TABS
ORAL_TABLET | ORAL | Status: AC
  Filled 2022-09-10: qty 2

## 2022-09-10 MED ORDER — ATROPINE SULFATE 0.4 MG/ML IV SOLN
INTRAVENOUS | Status: AC
  Filled 2022-09-10: qty 1

## 2022-09-10 MED ORDER — OXYCODONE HCL 5 MG/5ML PO SOLN
ORAL | Status: AC
  Filled 2022-09-10: qty 5

## 2022-09-10 MED ORDER — PROPOFOL 10 MG/ML IV BOLUS
INTRAVENOUS | Status: DC | PRN
  Administered 2022-09-10: 200 mg via INTRAVENOUS

## 2022-09-10 MED ORDER — FENTANYL CITRATE (PF) 100 MCG/2ML IJ SOLN
INTRAMUSCULAR | Status: DC | PRN
  Administered 2022-09-10: 100 ug via INTRAVENOUS
  Administered 2022-09-10: 50 ug via INTRAVENOUS

## 2022-09-10 MED ORDER — ROCURONIUM BROMIDE 10 MG/ML (PF) SYRINGE
PREFILLED_SYRINGE | INTRAVENOUS | Status: AC
  Filled 2022-09-10: qty 10

## 2022-09-10 MED ORDER — FENTANYL CITRATE (PF) 100 MCG/2ML IJ SOLN
25.0000 ug | INTRAMUSCULAR | Status: DC | PRN
  Administered 2022-09-10 (×2): 50 ug via INTRAVENOUS

## 2022-09-10 MED ORDER — CEFAZOLIN SODIUM-DEXTROSE 2-4 GM/100ML-% IV SOLN
INTRAVENOUS | Status: AC
  Filled 2022-09-10: qty 100

## 2022-09-10 MED ORDER — KETOROLAC TROMETHAMINE 30 MG/ML IJ SOLN
INTRAMUSCULAR | Status: AC
  Filled 2022-09-10: qty 1

## 2022-09-10 SURGICAL SUPPLY — 61 items
ADH SKN CLS APL DERMABOND .7 (GAUZE/BANDAGES/DRESSINGS) ×2
APL PRP STRL LF DISP 70% ISPRP (MISCELLANEOUS) ×1
BAG DECANTER FOR FLEXI CONT (MISCELLANEOUS) IMPLANT
BINDER BREAST 3XL (GAUZE/BANDAGES/DRESSINGS) IMPLANT
BINDER BREAST LRG (GAUZE/BANDAGES/DRESSINGS) IMPLANT
BINDER BREAST MEDIUM (GAUZE/BANDAGES/DRESSINGS) IMPLANT
BINDER BREAST XLRG (GAUZE/BANDAGES/DRESSINGS) IMPLANT
BINDER BREAST XXLRG (GAUZE/BANDAGES/DRESSINGS) IMPLANT
BIOPATCH RED 1 DISK 7.0 (GAUZE/BANDAGES/DRESSINGS) IMPLANT
BLADE SURG 10 STRL SS (BLADE) ×4 IMPLANT
BLADE SURG 15 STRL LF DISP TIS (BLADE) ×1 IMPLANT
BLADE SURG 15 STRL SS (BLADE) ×1
CANISTER SUCT 1200ML W/VALVE (MISCELLANEOUS) ×1 IMPLANT
CHLORAPREP W/TINT 26 (MISCELLANEOUS) ×1 IMPLANT
COVER SURGICAL LIGHT HANDLE (MISCELLANEOUS) IMPLANT
DERMABOND ADVANCED .7 DNX12 (GAUZE/BANDAGES/DRESSINGS) ×2 IMPLANT
DRAIN CHANNEL 19F RND (DRAIN) ×1 IMPLANT
DRAPE UTILITY XL STRL (DRAPES) IMPLANT
DRSG MEPILEX POST OP 4X8 (GAUZE/BANDAGES/DRESSINGS) ×2 IMPLANT
DRSG TEGADERM 4X4.75 (GAUZE/BANDAGES/DRESSINGS) IMPLANT
ELECT BLADE 4.0 EZ CLEAN MEGAD (MISCELLANEOUS) ×1
ELECT REM PT RETURN 9FT ADLT (ELECTROSURGICAL) ×1
ELECTRODE BLDE 4.0 EZ CLN MEGD (MISCELLANEOUS) ×1 IMPLANT
ELECTRODE REM PT RTRN 9FT ADLT (ELECTROSURGICAL) ×1 IMPLANT
EVACUATOR SILICONE 100CC (DRAIN) ×1 IMPLANT
GAUZE PAD ABD 8X10 STRL (GAUZE/BANDAGES/DRESSINGS) ×2 IMPLANT
GLOVE BIO SURGEON STRL SZ 6.5 (GLOVE) IMPLANT
GLOVE BIO SURGEON STRL SZ7.5 (GLOVE) ×1 IMPLANT
GLOVE BIO SURGEON STRL SZ8 (GLOVE) ×1 IMPLANT
GLOVE BIOGEL PI IND STRL 7.0 (GLOVE) IMPLANT
GLOVE BIOGEL PI IND STRL 8 (GLOVE) ×1 IMPLANT
GOWN STRL REUS W/ TWL LRG LVL3 (GOWN DISPOSABLE) IMPLANT
GOWN STRL REUS W/TWL LRG LVL3 (GOWN DISPOSABLE)
GOWN STRL REUS W/TWL XL LVL3 (GOWN DISPOSABLE) ×1 IMPLANT
HEMOSTAT ARISTA ABSORB 3G PWDR (HEMOSTASIS) IMPLANT
NDL HYPO 25X1 1.5 SAFETY (NEEDLE) IMPLANT
NDL SAFETY ECLIP 18X1.5 (MISCELLANEOUS) IMPLANT
NDL SPNL 18GX3.5 QUINCKE PK (NEEDLE) IMPLANT
NEEDLE HYPO 25X1 1.5 SAFETY (NEEDLE) IMPLANT
NEEDLE SPNL 18GX3.5 QUINCKE PK (NEEDLE) IMPLANT
NS IRRIG 1000ML POUR BTL (IV SOLUTION) ×1 IMPLANT
PACK BASIN DAY SURGERY FS (CUSTOM PROCEDURE TRAY) ×1 IMPLANT
PACK UNIVERSAL I (CUSTOM PROCEDURE TRAY) ×1 IMPLANT
PENCIL SMOKE EVACUATOR (MISCELLANEOUS) ×1 IMPLANT
PIN SAFETY STERILE (MISCELLANEOUS) IMPLANT
SLEEVE SCD COMPRESS KNEE MED (STOCKING) ×1 IMPLANT
SPONGE T-LAP 18X18 ~~LOC~~+RFID (SPONGE) ×3 IMPLANT
STAPLER VISISTAT 35W (STAPLE) ×1 IMPLANT
SUT MNCRL AB 3-0 PS2 18 (SUTURE) IMPLANT
SUT MNCRL AB 3-0 PS2 27 (SUTURE) ×3 IMPLANT
SUT MNCRL AB 4-0 PS2 18 (SUTURE) ×2 IMPLANT
SUT MON AB 2-0 CT1 36 (SUTURE) IMPLANT
SUT MON AB 5-0 PS2 18 (SUTURE) IMPLANT
SUT SILK 2 0 SH (SUTURE) ×1 IMPLANT
SUT VIC AB 3-0 PS1 18 (SUTURE)
SUT VIC AB 3-0 PS1 18XBRD (SUTURE) IMPLANT
SYR BULB IRRIG 60ML STRL (SYRINGE) ×1 IMPLANT
TOWEL GREEN STERILE FF (TOWEL DISPOSABLE) ×2 IMPLANT
TUBE CONNECTING 20X1/4 (TUBING) ×1 IMPLANT
UNDERPAD 30X36 HEAVY ABSORB (UNDERPADS AND DIAPERS) ×2 IMPLANT
YANKAUER SUCT BULB TIP NO VENT (SUCTIONS) ×1 IMPLANT

## 2022-09-10 NOTE — Transfer of Care (Signed)
Immediate Anesthesia Transfer of Care Note  Patient: Manpreet Grawe  Procedure(s) Performed: MAMMARY REDUCTION  (BREAST) (Bilateral: Breast)  Patient Location: PACU  Anesthesia Type:General  Level of Consciousness: awake, alert , oriented, drowsy, and patient cooperative  Airway & Oxygen Therapy: Patient Spontanous Breathing and Patient connected to face mask oxygen  Post-op Assessment: Report given to RN and Post -op Vital signs reviewed and stable  Post vital signs: Reviewed and stable  Last Vitals:  Vitals Value Taken Time  BP    Temp    Pulse 82 09/10/22 1446  Resp    SpO2 96 % 09/10/22 1446  Vitals shown include unvalidated device data.  Last Pain:  Vitals:   09/10/22 1015  TempSrc: Oral  PainSc: 3       Patients Stated Pain Goal: 6 (09/10/22 1015)  Complications: No notable events documented.

## 2022-09-10 NOTE — Discharge Instructions (Addendum)
INSTRUCTIONS FOR AFTER BREAST SURGERY   You will likely have some questions about what to expect following your operation.  The following information will help you and your family understand what to expect when you are discharged from the hospital.  It is important to follow these guidelines to help ensure a smooth recovery and reduce complication.  Postoperative instructions include information on: diet, wound care, medications and physical activity.  AFTER SURGERY Expect to go home after the procedure.  In some cases, you may need to spend one night in the hospital for observation.  DIET Breast surgery does not require a specific diet.  However, the healthier you eat the better your body will heal. It is important to increasing your protein intake.  This means limiting the foods with sugar and carbohydrates.  Focus on vegetables and some meat.  If you have liposuction during your procedure be sure to drink water.  If your urine is bright yellow, then it is concentrated, and you need to drink more water.  As a general rule after surgery, you should have 8 ounces of water every hour while awake.  If you find you are persistently nauseated or unable to take in liquids let us know.  NO TOBACCO USE or EXPOSURE.  This will slow your healing process and lead to a wound.  WOUND CARE Leave the binder on at all times except when showering . Use fragrance free soap like Dial, Dove or [place].   After 24 hours you can remove the binder to shower. Once dry apply binder or sports bra. No baths, pools or hot tubs for four weeks. We close your incision to leave the smallest and best-looking scar. No ointment or creams on your incisions for four weeks.  No Neosporin (Too many skin reactions).  A few weeks after surgery you can use Mederma and start massaging the scar. We ask you to wear your binder or sports bra for the first 6 weeks around the clock, including while sleeping. This provides added comfort and helps  reduce the fluid accumulation at the surgery site. NO Ice or heating pads to the operative site.  You have a very high risk of a BURN before you feel the temperature change.  ACTIVITY No heavy lifting until cleared by the doctor.  This usually means no more than a half-gallon of milk.  It is OK to walk and climb stairs. Moving your legs is very important to decrease your risk of a blood clot.  It will also help keep you from getting deconditioned.  Every 1 to 2 hours get up and walk for 5 minutes. This will help with a quicker recovery back to normal.  Let pain be your guide so you don't do too much.  This time is for you to recover.  You will be more comfortable if you sleep and rest with your head elevated either with a few pillows under you or in a recliner.  No stomach sleeping for a three months.  WORK Everyone returns to work at different times. As a rough guide, most people take at least 1 - 2 weeks off prior to returning to work. If you need documentation for your job, give the forms to the front staff at the clinic.  DRIVING Arrange for someone to bring you home from the hospital after your surgery.  You may be able to drive a few days after surgery but not while taking any narcotics or valium.  BOWEL MOVEMENTS Constipation can occur  after anesthesia and while taking pain medication.  It is important to stay ahead for your comfort.  We recommend taking Milk of Magnesia (2 tablespoons; twice a day) while taking the pain pills.  MEDICATIONS You may be prescribed should start after surgery At your preoperative visit for you history and physical you may have been given the following medications: Zofran 4 mg:  This is to treat nausea and vomiting.  You can take this every 6 hours as needed and only if needed. Oxycodone 5 mg:  This is only to be used after you have taken the Motrin or the Tylenol. Every 8 hours as needed.   Over the counter Medication to take: Ibuprofen (Motrin) 600 mg:   Take this every 6 hours.  If you have additional pain then take 500 mg of the Tylenol every 8 hours.  Only take the Norco after you have tried these two. MiraLAX or Milk of Magnesia: Take this according to the bottle if you take the Norco.  WHEN TO CALL Call your surgeon's office if any of the following occur: Fever 101 degrees F or greater Excessive bleeding or fluid from the incision site. Pain that increases over time without aid from the medications Redness, warmth, or pus draining from incision sites Persistent nausea or inability to take in liquids Severe misshapen area that underwent the operation.  Here are some resources for breast cancer patients:  Plastic surgery website: https://www.plasticsurgery.org/for-medical-professionals/education-and-resources/publications/breast-reconstruction-magazine Breast Reconstruction Awareness Campaign:  [employer]fr Plastic surgery Implant information:  https://www.plasticsurgery.org/patient-safety/breast-implant-safety    Can have tylenol after 6 pm if needed  Can have ibuprofen after 9pm if needed   Post Anesthesia Home Care Instructions  Activity: Get plenty of rest for the remainder of the day. A responsible individual must stay with you for 24 hours following the procedure.  For the next 24 hours, DO NOT: -Drive a car -Advertising copywriter -Drink alcoholic beverages -Take any medication unless instructed by your physician -Make any legal decisions or sign important papers.  Meals: Start with liquid foods such as gelatin or soup. Progress to regular foods as tolerated. Avoid greasy, spicy, heavy foods. If nausea and/or vomiting occur, drink only clear liquids until the nausea and/or vomiting subsides. Call your physician if vomiting continues.  Special Instructions/Symptoms: Your throat may feel dry or sore from the anesthesia or the breathing tube placed in your throat during surgery. If this causes discomfort,  gargle with warm salt water. The discomfort should disappear within 24 hours.

## 2022-09-10 NOTE — Interval H&P Note (Signed)
History and Physical Interval Note: No change in physical exam or indication for procedure. Pt marked for a breast reduction with her assistance. All questions answered to her satisfaction. Will proceed at her request.  09/10/2022 11:22 AM  Michelle [name]  has presented today for surgery, with the diagnosis of macromastia.  The various methods of treatment have been discussed with the patient and family. After consideration of risks, benefits and other options for treatment, the patient has consented to  Procedure(s): MAMMARY REDUCTION  (BREAST) (Bilateral) as a surgical intervention.  The patient's history has been reviewed, patient examined, no change in status, stable for surgery.  I have reviewed the patient's chart and labs.  Questions were answered to the patient's satisfaction.     Santiago Glad

## 2022-09-10 NOTE — Op Note (Signed)
DATE OF OPERATION: 09/10/2022  LOCATION: [facility] operating Room  PREOPERATIVE DIAGNOSIS: Symptomatic macromastia  POSTOPERATIVE DIAGNOSIS: Same  PROCEDURE: Bilateral breast reduction  SURGEON: Loren Racer, MD  ASSISTANT: Elspeth Cho, Caroline More  EBL: 150 cc  CONDITION: Stable  COMPLICATIONS: None  INDICATION: The patient, Michelle [name], is a 22 y.o. female born on 06-05-2000, is here for treatment upper back and neck pain which she attributes to the large size of her breast.   PROCEDURE DETAILS:  The patient was seen prior to surgery and marked.   IV antibiotics were given. The patient was taken to the operating room and given a general anesthetic. A standard time out was performed and all information was confirmed by those in the room. SCDs were placed.   The breasts were prepped and draped in usual sterile manner.  The nipples and areola complex were marked with a 42 mm cookie cutter and an 8 cm based pedicle was outlined on each breast.  The right breast was addressed first.  A laparotomy tape was placed at the base of the breast and the pedicle was de-epithelialized sharply.  The electrocautery was used to dissected the pedicle down to the chest wall.  The electrocautery was then used to resect the medial lateral and superior triangles of breast tissue and to develop and thin the superior skin flap.  The total weight of breast tissue removed from the right breast was 1092 g.  The surgical site was inspected for bleeding and hemostasis was achieved with the electrocautery.  The surgical site was irrigated with saline.  A 19 French round drain was placed behind the pedicle and brought out through a separate stab incision.  The T point was reapproximated with a single interrupted 2-0 Monocryl suture and the skin edges tailor tacked in place with skin clips.  The dermal edges were approximated with interrupted 3-0 Monocryl sutures and the skin was closed with a running  4-0 Monocryl subcuticular stitch.  Attention was then turned to the left breast where a similar procedure was performed.  Laparotomy tape was placed at the base of the breast and the pedicle was de-epithelialized sharply then dissected down to the chest wall with the electrocautery.  The medial lateral and superior triangles of breast tissue were resecte and the superior skin flap was developed and thinned.  The total weight of breast tissue resected on the left side was 1124 g.  Again hemostasis was achieved with the electrocautery and the surgical wound was irrigated with saline.  A 19 French round drain was placed behind the pedicle and brought out through a separate stab incision.  The T point was approximated with interrupted 2-0 Monocryl suture and then skin edges tailor tacked in place with skin clips.  The dermal edges were approximated with interrupted 3-0 Monocryl sutures and the skin was closed with a running 4-0 Monocryl subcuticular stitch.  All incisions were then sealed with Dermabond.  The patient was placed in a supportive garment and awakened from anesthesia without incident.  There were no complications and all instrument needle and sponge counts were reported as correct.  The family was notified at the end of the case.   The advanced practice practitioner (APP) assisted throughout the case.  The APP was essential in retraction and counter traction when needed to make the case progress smoothly.  This retraction and assistance made it possible to see the tissue plans for the procedure.  The assistance was needed for blood control, tissue  re-approximation and assisted with closure of the incision site.

## 2022-09-10 NOTE — Anesthesia Preprocedure Evaluation (Addendum)
Anesthesia Evaluation  Patient identified by MRN, date of birth, ID band Patient awake    Reviewed: Allergy & Precautions, H&P , NPO status , Patient's Chart, lab work & pertinent test results  Airway Mallampati: I  TM Distance: >3 FB Neck ROM: Full    Dental no notable dental hx. (+) Teeth Intact, Dental Advisory Given   Pulmonary neg pulmonary ROS   Pulmonary exam normal breath sounds clear to auscultation       Cardiovascular Exercise Tolerance: Good negative cardio ROS Normal cardiovascular exam Rhythm:Regular Rate:Normal     Neuro/Psych  PSYCHIATRIC DISORDERS Anxiety Depression    negative neurological ROS  negative psych ROS   GI/Hepatic negative GI ROS, Neg liver ROS,,,  Endo/Other  negative endocrine ROS  Morbid obesity  Renal/GU negative Renal ROS  negative genitourinary   Musculoskeletal negative musculoskeletal ROS (+)    Abdominal   Peds negative pediatric ROS (+)  Hematology negative hematology ROS (+)   Anesthesia Other Findings   Reproductive/Obstetrics negative OB ROS                             Anesthesia Physical Anesthesia Plan  ASA: 2  Anesthesia Plan: General   Post-op Pain Management: Toradol IV (intra-op)* and Ofirmev IV (intra-op)*   Induction: Intravenous  PONV Risk Score and Plan: 3 and Ondansetron, Dexamethasone and Treatment may vary due to age or medical condition  Airway Management Planned: Oral ETT and LMA  Additional Equipment: None  Intra-op Plan:   Post-operative Plan: Extubation in OR  Informed Consent: I have reviewed the patients History and Physical, chart, labs and discussed the procedure including the risks, benefits and alternatives for the proposed anesthesia with the patient or authorized representative who has indicated his/her understanding and acceptance.       Plan Discussed with: Anesthesiologist and CRNA  Anesthesia  Plan Comments: (  )        Anesthesia Quick Evaluation

## 2022-09-10 NOTE — Anesthesia Procedure Notes (Addendum)
Procedure Name: Intubation Date/Time: 09/10/2022 11:46 AM  Performed by: Ronnette Hila, CRNAPre-anesthesia Checklist: Patient identified, Emergency Drugs available, Suction available and Patient being monitored Patient Re-evaluated:Patient Re-evaluated prior to induction Oxygen Delivery Method: Circle system utilized Preoxygenation: Pre-oxygenation with 100% oxygen Induction Type: IV induction Ventilation: Mask ventilation without difficulty Laryngoscope Size: Miller and 2 Grade View: Grade I Tube type: Oral Tube size: 7.0 mm Number of attempts: 1 Airway Equipment and Method: Stylet Placement Confirmation: ETT inserted through vocal cords under direct vision, positive ETCO2 and breath sounds checked- equal and bilateral Secured at: 21 cm Tube secured with: Tape Dental Injury: Teeth and Oropharynx as per pre-operative assessment

## 2022-09-11 ENCOUNTER — Ambulatory Visit (INDEPENDENT_AMBULATORY_CARE_PROVIDER_SITE_OTHER): Payer: BC Managed Care – PPO | Admitting: Student

## 2022-09-11 ENCOUNTER — Encounter (HOSPITAL_BASED_OUTPATIENT_CLINIC_OR_DEPARTMENT_OTHER): Payer: Self-pay | Admitting: Plastic Surgery

## 2022-09-11 VITALS — BP 102/60 | HR 74

## 2022-09-11 DIAGNOSIS — N62 Hypertrophy of breast: Secondary | ICD-10-CM

## 2022-09-11 NOTE — Progress Notes (Signed)
Patient is a 22 year old female who underwent bilateral breast reduction with Dr. Ladona Ridgel yesterday, 09/10/2022.  She is postop day 1.  Patient presents to the clinic today for evaluation and drain removal.  Today, patient presents with her mother at bedside.  She reports she is doing well.  She states that her pain is overall controlled.  Patient does report that when she got out of bed this morning, she did notice a little bit of drainage from the incision.  She denies any fevers or chills.  She denies any other issues or concerns.  Patient reports that her right drain has been putting out approximately 30 cc in her left drain has been putting out approximately 35 cc in the past 24 hours.  Chaperone present on exam.  On exam, patient is sitting upright in no acute distress.  Breasts are soft and fairly symmetric.  There is some swelling noted to both breasts, little bit more on the left than the right.  There is no overlying erythema.  There is some ecchymosis to the breast bilaterally.  There are no significant fluid collections palpated on exam.  NAC's appear to be viable bilaterally.  Sensations are intact to the NAC's bilaterally.  Incisions are clean dry and intact.  JP drains are in place bilaterally and are functioning.  There is serosanguineous drainage in each of the bulbs.  Drains were removed without difficulty bilaterally.  Patient tolerated well.  I discussed with the patient and the patient's mother that they should apply gauze and tape over the drain sites daily.  I discussed with them that she may experience some drainage over the next few days, and that they should change the gauze daily or as needed for saturation.  I discussed with them they can also apply an ABD pad over her incisions.  Patient expressed understanding.  I discussed with the patient that I like her to continue compression at all times and avoid vigorous activities.  Patient expressed understanding.  Patient to  follow-up at her scheduled appointment next week.  I instructed her to call us if she has any questions or concerns about anything.

## 2022-09-12 NOTE — Anesthesia Postprocedure Evaluation (Signed)
Anesthesia Post Note  Patient: Michelle [name]  Procedure(s) Performed: MAMMARY REDUCTION  (BREAST) (Bilateral: Breast)     Patient location during evaluation: PACU Anesthesia Type: General Level of consciousness: awake and alert Pain management: pain level controlled Vital Signs Assessment: post-procedure vital signs reviewed and stable Respiratory status: spontaneous breathing, nonlabored ventilation, respiratory function stable and patient connected to nasal cannula oxygen Cardiovascular status: blood pressure returned to baseline and stable Postop Assessment: no apparent nausea or vomiting Anesthetic complications: no   No notable events documented.  Last Vitals:  Vitals:   09/10/22 1530 09/10/22 1552  BP: 112/75 97/85  Pulse: 61 71  Resp: (!) 9 16  Temp:  (!) 36.3 C  SpO2: 90% 95%    Last Pain:  Vitals:   09/11/22 0830  TempSrc:   PainSc: 0-No pain   Pain Goal: Patients Stated Pain Goal: 6 (09/10/22 1015)                 Masson Nalepa

## 2022-09-13 ENCOUNTER — Telehealth: Payer: Self-pay | Admitting: Student

## 2022-09-13 NOTE — Telephone Encounter (Signed)
Patient called the clinic with concerns about drainage from her left breast incision.  She states the drainage is yellowish and thin.  She denies any fevers or chills.  She denies any significant pain in the area or significant swelling.  I discussed with the patient that it is normal to have some drainage after surgery.  I discussed with her that I would like her to apply gauze and an ABD pad or maxi pad over the area and change daily or as needed if soiled.  I expressed the importance of wearing continued compression to the patient.  She expressed understanding.  Patient has an appointment next week.  I discussed with the patient that if she continues to have drainage or if she has any worsening symptoms or concerns, we can see her sooner.  Patient was in agreement with this plan.  I instructed the patient to call back if she has any questions or concerns about anything.

## 2022-09-15 LAB — SURGICAL PATHOLOGY

## 2022-09-19 ENCOUNTER — Encounter: Payer: Self-pay | Admitting: Plastic Surgery

## 2022-09-19 ENCOUNTER — Ambulatory Visit (INDEPENDENT_AMBULATORY_CARE_PROVIDER_SITE_OTHER): Payer: BC Managed Care – PPO | Admitting: Plastic Surgery

## 2022-09-19 VITALS — BP 119/67 | HR 77 | Ht 62.5 in | Wt 199.0 lb

## 2022-09-19 DIAGNOSIS — Z9889 Other specified postprocedural states: Secondary | ICD-10-CM

## 2022-09-19 NOTE — Progress Notes (Signed)
Michelle [name] returns today approximately 9 days postoperative from her bilateral breast reduction.  She notes that she still has a small amount of drainage from her incisions especially on the left.  She is doing well with minimal pain.  On physical examination she has a moderate amount of ecchymoses as well has the expected erythema around the incisions.  All incisions are clean dry and intact.  She has normal sensation in the nipples both of which are well-perfused.  She is instructed to continue wearing moderate compression however she may switch to a sports bra if she wishes.  She is encouraged to slowly increase activity as tolerated.  We discussed beginning scar massage at 2 weeks after her operative date.  Follow-up at next scheduled appointment.

## 2022-09-27 ENCOUNTER — Ambulatory Visit (INDEPENDENT_AMBULATORY_CARE_PROVIDER_SITE_OTHER): Payer: BC Managed Care – PPO | Admitting: Surgical

## 2022-09-27 ENCOUNTER — Encounter: Payer: Self-pay | Admitting: Surgical

## 2022-09-27 VITALS — BP 119/77 | HR 71 | Ht 62.0 in | Wt 190.0 lb

## 2022-09-27 DIAGNOSIS — Z9889 Other specified postprocedural states: Secondary | ICD-10-CM

## 2022-09-27 NOTE — Progress Notes (Signed)
Patient is a 22 year old female here for follow-up after bilateral breast reduction with Dr. Ladona Ridgel on 09/10/2022.  She is 2 and half weeks postop.  She presents today for concerns related to bilateral breast.  She reports she has noticed some irritation and redness of the left breast, and reports she was concerned because she was unable to see the incisions on the left breast, but could see the incisions on the right breast.  She is not having any infectious symptoms.  She reports she is having some burning pain on the right breast.  Otherwise feels well.  Chaperone present on exam On exam bilateral NAC's are viable, bilateral breasts are symmetric.  There is no cellulitic changes noted.  I do not appreciate any subcutaneous fluid collections on exam.  She does have some's small wounds of the left and right breast that appear superficial.  There is some scabbing noted.  There is no active drainage at this time.  She has mild tenderness with palpation.  A/P:  Patient is a 22 year old female status post breast reduction 2 and half weeks ago.  On exam, overall everything appears to be doing well.  She does have some small scabbing wounds, but no signs of infection at this time.  Recommend Vaseline and gauze to the areas where open wounds are present.  We discussed continuing with compressive garments, this could be a sports bra or breast binder, patient preference.   She has a scheduled follow-up for next week, we will plan to see her then.  She is aware to call with questions or concerns.  Pictures were obtained of the patient and placed in the chart with the patient's or guardian's permission.

## 2022-10-03 ENCOUNTER — Ambulatory Visit (INDEPENDENT_AMBULATORY_CARE_PROVIDER_SITE_OTHER): Payer: BC Managed Care – PPO | Admitting: Student

## 2022-10-03 DIAGNOSIS — Z9889 Other specified postprocedural states: Secondary | ICD-10-CM

## 2022-10-03 NOTE — Progress Notes (Signed)
Patient is a 22 year old female who underwent bilateral breast reduction with Dr. Ladona Ridgel on 09/10/2022.  She is a little over 3 weeks postop.  Patient presents to the clinic today for postoperative follow-up.  Patient was last seen in the clinic on 09/27/2022.  At this visit, patient had some concerns related to her bilateral breast.  Patient reported she noticed to some irritation and redness of the left breast and was concerned because she was unable to see the incisions on the left breast.  She did not have any infectious symptoms.  She did reports she had some burning on the right breast.  On exam, bilateral breasts were symmetric and NAC's were viable bilaterally.  There were no cellulitic changes noted.  There were no fluid collections on exam.  There were some small wounds of the left and right breast that appeared superficial.  There is no active drainage on exam.  Plan is for patient to apply Vaseline and gauze to the areas were open wounds were present.  Plan was for patient to follow-up in 1 week.  Today, Patient reports she is doing well.  She states that she feels her wounds have improved since her previous visit.  She states he has been applying Vaseline to the wounds daily.  She reports she has some drainage from the wound to her right breast.  She denies any drainage from the wounds to her left breast.  She denies any fevers or chills.   Chaperone present on exam.  On exam, patient is sitting upright in no acute distress.  Breasts are soft and are symmetric.  NAC's are viable bilaterally.  There is no overlying erythema.  There is some mild ecchymosis noted, more so to the right breast on the left breast.  To the right breast, there is an approximately 2 cm x 0.5 cm wound to the inframammary incision.  There is a suture knot here that was protruding from the skin.  This was cut and removed.  Dark brownish/purpleish drainage was noted to be coming from the wound.  It appeared to be consistent with  old blood.  I was able to massage out some what appeared to be clots and old blood consistent with possible small old hematoma.  There is no surrounding erythema or signs of infection on exam.  To the left breast, there are several superficial wounds noted.  To the wound just inferior of the NAC, there appears to be some scabbing.  There is no drainage noted and there is no surrounding erythema.  There are no signs of infection.  Incisions are otherwise intact.  I discussed with the patient that she can apply Vaseline to her incision and to the wounds to her left breast daily.  I discussed with the patient we will have her apply calcium alginate and a bandage or a Mepilex border dressing to the wound to her right breast.  I told her to gently massage the right breast.  Patient expressed understanding.  I instructed the patient to continue compression at all times.  Discussed with her that she may start slowly increasing her activities.  Patient to return in 2 weeks.  I instructed her to call if she has any questions or concerns in the meantime.  Pictures were obtained of the patient and placed in the chart with the patient's or guardian's permission.

## 2022-10-08 NOTE — Telephone Encounter (Signed)
Patient called and said that we were going to contact her insurance to see if they would cover gauze and tape, she wanted to know if we had heard anything back. Please advise. Call back is [phone]

## 2022-10-11 NOTE — Telephone Encounter (Signed)
Faxed wound care supply request to [employer]. It was on hold because patient stated she needed to upload her new insurance card, however, she called and stated she hadn't heard anything from [employer] and insurance info we had was correct.  Faxed over what we have with confirmed receipt. Pt aware.

## 2022-10-15 NOTE — Progress Notes (Signed)
Patient is a pleasant 22 year old female with PMH of macromastia s/p bilateral breast reduction performed 09/10/2022 by Dr. Ladona Ridgel who presents to clinic for postoperative follow-up.  She was last seen here in clinic on 10/03/2022.  At that time, right breast had a 2 x 0.5 cm wound on inframammary incision.  Some dark brown/purple drainage was expressed with palpation, thought to be consistent with old blood.  Additional clots were massaged out.  Near superficial wounds noted to the left side.  Recommended Vaseline to the incisions throughout as well as gentle massage of the breasts.  Recommended calcium alginate to the right breast followed by bordered Mepilex dressing.  Follow-up in 2 weeks.  Today,

## 2022-10-16 ENCOUNTER — Encounter: Payer: Self-pay | Admitting: Physician Assistant

## 2022-10-16 ENCOUNTER — Encounter: Payer: BC Managed Care – PPO | Admitting: Student

## 2022-10-16 ENCOUNTER — Ambulatory Visit (INDEPENDENT_AMBULATORY_CARE_PROVIDER_SITE_OTHER): Payer: BC Managed Care – PPO | Admitting: Physician Assistant

## 2022-10-16 VITALS — BP 113/80 | HR 79 | Ht 62.5 in | Wt 186.0 lb

## 2022-10-16 DIAGNOSIS — Z9889 Other specified postprocedural states: Secondary | ICD-10-CM

## 2022-11-07 ENCOUNTER — Ambulatory Visit (INDEPENDENT_AMBULATORY_CARE_PROVIDER_SITE_OTHER): Payer: BC Managed Care – PPO | Admitting: Physician Assistant

## 2022-11-07 VITALS — BP 117/81 | HR 92

## 2022-11-07 DIAGNOSIS — Z9889 Other specified postprocedural states: Secondary | ICD-10-CM

## 2022-11-07 NOTE — Progress Notes (Signed)
Patient is a pleasant 22 year old female with PMH of macromastia s/p bilateral breast reduction performed 09/10/2022 by Dr. Ladona Ridgel who presents to clinic for postoperative follow-up.   She was last seen here in clinic on 10/16/2022.  At that time, she denied any ongoing drainage from right breast wound and on exam the wound appeared to of healed nicely.  Mild areas of firmness along inframammary incisions bilaterally, left greater than right.  Recommended gentle massage of the firm areas as well as continued activity modifications and compressive garments.  Follow-up in 2 weeks, but can cancel if she feels she is well-healed at that time.  Today, patient is doing okay.  She states that approximately 10 days ago she started having some worsening discomfort on the left breast.  She states that it is tight with certain movements and that movements aggravate her discomfort.  When asked where specifically she hurts, she describes the whole breast, but mostly on the superior aspect.  Patient reports that her discomfort is sometimes improved with gentle pressure of the area of concern.  She denies any swelling, fevers, redness, drainage, malodor, or other symptoms.  She also denies any specific aggravating event or injury.  Patient also states that she is bothered by excess axillary tissue bilaterally.  On exam, breasts have excellent shape and symmetry.  Moderately soft throughout, no obvious areas of significant firmness or swelling.  No induration or other overlying skin changes otherwise concerning for infection.  Mild residual areas of Dermabond over inframammary incisions out laterally, but otherwise incisions CDI.  Areolas are healthy appearing.  The etiology of her discomfort is unclear, but given that it is aggravated by certain movements, suspect that there could be a muscular component.  Will refer to PT.  Will follow-up with her via telephone encounter in 3 weeks.  Her physical exam is entirely benign.  As  for her excess tissue out laterally, if it is not improved or if she is still bothered in 3 months, she can discuss further with Dr. Ladona Ridgel for consult.  Patient understands to call the clinic should she have any questions or concerns in interim or more importantly if she has any worsening symptoms.  Picture(s) obtained of the patient and placed in the chart were with the patient's or guardian's permission.

## 2022-11-18 ENCOUNTER — Other Ambulatory Visit: Payer: Self-pay

## 2022-11-18 ENCOUNTER — Ambulatory Visit: Payer: BC Managed Care – PPO | Attending: Physician Assistant | Admitting: Rehabilitation

## 2022-11-18 ENCOUNTER — Encounter: Payer: Self-pay | Admitting: Rehabilitation

## 2022-11-18 DIAGNOSIS — Z9889 Other specified postprocedural states: Secondary | ICD-10-CM | POA: Diagnosis present

## 2022-11-18 DIAGNOSIS — N644 Mastodynia: Secondary | ICD-10-CM

## 2022-11-18 DIAGNOSIS — M25612 Stiffness of left shoulder, not elsewhere classified: Secondary | ICD-10-CM

## 2022-11-18 NOTE — Therapy (Signed)
OUTPATIENT PHYSICAL THERAPY  UPPER EXTREMITY ONCOLOGY EVALUATION  Patient Name: Michelle [name] MRN: [id number] DOB:02-Feb-2001, 22 y.o., female Today's Date: 11/18/2022  END OF SESSION:  PT End of Session - 11/18/22 1705     Visit Number 1    Number of Visits 9    Date for PT Re-Evaluation 12/16/22    PT Start Time 1500    PT Stop Time 1555    PT Time Calculation (min) 55 min    Activity Tolerance Patient tolerated treatment well    Behavior During Therapy [facility] for tasks assessed/performed             Past Medical History:  Diagnosis Date   Anxiety    Depression    H/O seasonal allergies    Past Surgical History:  Procedure Laterality Date   BREAST REDUCTION SURGERY Bilateral 09/10/2022   Procedure: MAMMARY REDUCTION  (BREAST);  Surgeon: Santiago Glad, MD;  Location: [facility];  Service: Plastics;  Laterality: Bilateral;   WISDOM TOOTH EXTRACTION     Patient Active Problem List   Diagnosis Date Noted   Morbid obesity (HCC) 02/20/2020   Hyperinsulinemia 01/02/2019   Acanthosis nigricans 01/01/2019   Anxiety 01/01/2019   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   PMDD (premenstrual dysphoric disorder) 01/24/2017    PCP: Lia Hopping, MD  REFERRING PROVIDER: Evelena Leyden PA-C  REFERRING DIAG: [phone] (ICD-10-CM) - S/P bilateral breast reduction   THERAPY DIAG:  Breast pain, left  H/O bilateral breast reduction surgery  Stiffness of left shoulder, not elsewhere classified  ONSET DATE: 09/10/22  Rationale for Evaluation and Treatment: Rehabilitation  SUBJECTIVE:                                                                                                                                                                                           SUBJECTIVE STATEMENT: It is just the left side.  It is like things like reaching up I get a muscle pain.  I also get some mid back pain (around scapula) I feel like the left breast  is maybe a bit higher.  They did say to wait 3-6 months to see if it settles.    PERTINENT HISTORY: bil breast reduction 09/10/22.   PAIN:  Are you having pain? No Only when reaching up Left anterior breast/pectoralis region - not axilla or shoulder  PRECAUTIONS: None  WEIGHT BEARING RESTRICTIONS: No  FALLS:  Has patient fallen in last 6 months? 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   OBSERVATIONS / OTHER ASSESSMENTS: breasts well healed and symmetrical in seated, in supine the left anterior chest/breast appears larger.   POSTURE: WNL  UPPER EXTREMITY AROM/PROM:  A/PROM RIGHT   eval   Shoulder extension 50  Shoulder flexion 155  Shoulder abduction 170  Shoulder internal rotation   Shoulder external rotation 80    (Blank rows = not tested)  A/PROM LEFT   eval  Shoulder extension 50  Shoulder flexion 135 - pull side of breast  Shoulder abduction 165 - pull in axilla   Shoulder internal rotation   Shoulder external rotation 80    (Blank rows = not tested)  CERVICAL AROM: All within normal limits: reports some tension due to sleeping on her back  UPPER EXTREMITY STRENGTH:   QUICK DASH SURVEY: 56%   TODAY'S TREATMENT:                                                                                                                                          DATE: 11/18/22 Eval performed Supine PROM - general MLD towards axilla on the Lt: axillary nodes, clavicular nodes and lateral trunk -  edu on this for home. STM Lt pectoralis Performance of HEP per below with instruction on each  PATIENT EDUCATION:  Education details: initial HEP Person educated: Patient Education method: Explanation, Demonstration, and Tactile cues Education comprehension: verbalized understanding and returned demonstration  HOME EXERCISE PROGRAM: Access Code: [email or web address] URL: [email or web address] Date: 11/18/2022 Prepared by: Gwenevere Abbot  Exercises - Supine Chest Stretch with Elbows Bent  - 1 x daily - 7 x weekly - 1 sets - 3 reps - 30-60seconds hold - Supine Lower Trunk Rotation  - 1 x daily - 7 x weekly - 1-3 sets - 10 reps - 20-30 seconds hold - Single Arm Doorway Pec Stretch at 90 Degrees Abduction  - 1 x daily - 7 x weekly - 10 reps - 20-30 second hold - Standing Shoulder Abduction Slides at Wall  - 1 x daily - 7 x weekly - 1-3 sets - 10 reps - 20-30 seconds hold  ASSESSMENT:  CLINICAL IMPRESSION: Patient is a 22 y.o. female who was seen today for physical therapy evaluation and treatment for her left breast, pectoralis, lateral trunk, and axilla pain after reduction 10 weeks ago tomorrow.  She has possible increased edema or at least increased size in supine with left pectoralis tension/tightness compared to the right but appears symmetrical in seated.  Surgeon does not feel like she is swollen.  She is also worried about the excess lateral skin but knows  she can remove it if needed.  She has pain with active abduction, flexion and reaching and cleaning at home and with work.  She was educated on an initial HEP to start and we will start massage/MLD as needed.    OBJECTIVE IMPAIRMENTS: decreased activity tolerance, decreased ROM, impaired UE functional use, and pain.   ACTIVITY LIMITATIONS: carrying, lifting, and reach over head  PARTICIPATION LIMITATIONS: cleaning and occupation  PERSONAL FACTORS: none are also affecting patient's functional outcome.   REHAB POTENTIAL:  Excellent  CLINICAL DECISION MAKING: Stable/uncomplicated  EVALUATION COMPLEXITY: Low  GOALS: Goals reviewed with patient? Yes  SHORT TERM GOALS: Target date: 11/18/22   Pt will be ind with initial HEP Baseline: Goal status: MET   LONG TERM GOALS: Target date: 12/23/22  Pt will improve AROM to full on the left without restriction Baseline:  Goal status: INITIAL  2.  Pt will return to gym activities without restriction Baseline:  Goal status: INITIAL  PLAN:  PT FREQUENCY: 1-2x/week  PT DURATION: 4 weeks  PLANNED INTERVENTIONS: Therapeutic exercises, Patient/Family education, Self Care, Manual therapy, and Re-evaluation  PLAN FOR NEXT SESSION: Left PROM/AAROM, STM pec, return to gym activities and lifting  Idamae Lusher, PT 11/18/2022, 5:12 PM

## 2022-11-20 ENCOUNTER — Encounter: Payer: Self-pay | Admitting: Rehabilitation

## 2022-11-20 ENCOUNTER — Ambulatory Visit: Payer: BC Managed Care – PPO | Admitting: Rehabilitation

## 2022-11-20 DIAGNOSIS — Z9889 Other specified postprocedural states: Secondary | ICD-10-CM | POA: Diagnosis not present

## 2022-11-20 DIAGNOSIS — N644 Mastodynia: Secondary | ICD-10-CM

## 2022-11-20 DIAGNOSIS — M25612 Stiffness of left shoulder, not elsewhere classified: Secondary | ICD-10-CM

## 2022-11-20 NOTE — Therapy (Signed)
OUTPATIENT PHYSICAL THERAPY  UPPER EXTREMITY ONCOLOGY EVALUATION  Patient Name: Michelle [name] MRN: [id number] DOB:2000-07-07, 22 y.o., female Today's Date: 11/20/2022  END OF SESSION:  PT End of Session - 11/20/22 1502     Visit Number 2    Number of Visits 9    Date for PT Re-Evaluation 12/16/22    PT Start Time 1505    PT Stop Time 1545    PT Time Calculation (min) 40 min    Activity Tolerance Patient tolerated treatment well    Behavior During Therapy [facility] for tasks assessed/performed             Past Medical History:  Diagnosis Date   Anxiety    Depression    H/O seasonal allergies    Past Surgical History:  Procedure Laterality Date   BREAST REDUCTION SURGERY Bilateral 09/10/2022   Procedure: MAMMARY REDUCTION  (BREAST);  Surgeon: Santiago Glad, MD;  Location: [facility];  Service: Plastics;  Laterality: Bilateral;   WISDOM TOOTH EXTRACTION     Patient Active Problem List   Diagnosis Date Noted   Morbid obesity (HCC) 02/20/2020   Hyperinsulinemia 01/02/2019   Acanthosis nigricans 01/01/2019   Anxiety 01/01/2019   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   PMDD (premenstrual dysphoric disorder) 01/24/2017    PCP: Lia Hopping, MD  REFERRING PROVIDER: Evelena Leyden PA-C  REFERRING DIAG: [phone] (ICD-10-CM) - S/P bilateral breast reduction   THERAPY DIAG:  Breast pain, left  H/O bilateral breast reduction surgery  Stiffness of left shoulder, not elsewhere classified  ONSET DATE: 09/10/22  Rationale for Evaluation and Treatment: Rehabilitation  SUBJECTIVE:                                                                                                                                                                                           SUBJECTIVE STATEMENT: I tried sleeping on my sides!  I think it feels better.  It didn't hurt when the kids hugged me.   EVAL: It is just the left side.  It is like things like  reaching up I get a muscle pain.  I also get some mid back pain (around scapula) I feel like the left breast is maybe a bit higher.  They did say to wait 3-6 months to see if it settles.    PERTINENT HISTORY: bil breast reduction 09/10/22.   PAIN:  Are you having pain? No Only when reaching up Left anterior breast/pectoralis region - not axilla or shoulder  PRECAUTIONS: None  WEIGHT BEARING RESTRICTIONS: No  FALLS:  Has patient fallen in last 6 months? 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   OBSERVATIONS / OTHER ASSESSMENTS: breasts well healed and symmetrical in seated, in supine the left anterior chest/breast appears larger.   POSTURE: WNL  UPPER EXTREMITY AROM/PROM:  A/PROM RIGHT   eval   Shoulder extension 50  Shoulder flexion 155  Shoulder abduction 170  Shoulder internal rotation   Shoulder external rotation 80    (Blank rows = not tested)  A/PROM LEFT   eval  Shoulder extension 50  Shoulder flexion 135 - pull side of breast  Shoulder abduction 165 - pull in axilla   Shoulder internal rotation   Shoulder external rotation 80    (Blank rows = not tested)  CERVICAL AROM: All within normal limits: reports some tension due to sleeping on her back  UPPER EXTREMITY STRENGTH:   QUICK DASH SURVEY: 56%   TODAY'S TREATMENT:                                                                                                                                          DATE:  11/20/22 Lt shoulder  PROM to tolerance STM/MFR Lt axilla, pectoralis, lat in arm propped overhead position MLD towards axilla and clavicle Pulleys x into flexion and abduction each with instruction Supine dowel flexion x 6   11/18/22 Eval performed Supine PROM - general MLD towards axilla on the Lt: axillary nodes, clavicular nodes and lateral trunk - edu on this for home. STM Lt pectoralis Performance of HEP per below with instruction on each  PATIENT EDUCATION:  Education details: initial HEP Person educated: Patient Education method: Explanation, Demonstration, and Tactile cues Education comprehension: verbalized understanding and returned demonstration  HOME EXERCISE PROGRAM: Access Code: [email or web address] URL: [email or web address] Date: 11/18/2022 Prepared by: Gwenevere Abbot  Exercises - Supine Chest Stretch with Elbows Bent  - 1 x daily - 7 x weekly - 1 sets - 3 reps - 30-60seconds hold - Supine Lower Trunk Rotation  - 1 x daily - 7 x weekly - 1-3 sets - 10 reps - 20-30 seconds hold - Single Arm Doorway Pec Stretch at 90 Degrees Abduction  - 1 x daily - 7 x weekly - 10 reps - 20-30 second hold - Standing Shoulder Abduction Slides at Wall  - 1 x daily - 7 x weekly - 1-3 sets - 10 reps - 20-30 seconds hold  ASSESSMENT:  CLINICAL IMPRESSION: Pt is doing better already.  She continues with tenderness in the left axilla.  OBJECTIVE IMPAIRMENTS: decreased activity tolerance, decreased ROM, impaired UE functional use, and pain.   ACTIVITY LIMITATIONS: carrying, lifting, and reach over head  PARTICIPATION LIMITATIONS: cleaning and occupation  PERSONAL FACTORS: none are also affecting patient's functional outcome.   REHAB POTENTIAL: Excellent  CLINICAL DECISION MAKING: Stable/uncomplicated  EVALUATION COMPLEXITY: Low  GOALS: Goals reviewed with patient? Yes  SHORT TERM GOALS: Target date: 11/18/22   Pt will be ind with initial HEP Baseline: Goal status: MET   LONG TERM GOALS:  Target date: 12/23/22  Pt will improve AROM to full on the left without restriction Baseline:  Goal status: INITIAL  2.  Pt will return to gym activities without restriction Baseline:  Goal status: INITIAL  PLAN:  PT FREQUENCY: 1-2x/week  PT DURATION: 4 weeks  PLANNED INTERVENTIONS: Therapeutic exercises, Patient/Family education, Self Care, Manual therapy, and Re-evaluation  PLAN FOR NEXT SESSION: Left PROM/AAROM, STM pec, return to gym activities and lifting  Idamae Lusher, PT 11/20/2022, 3:46 PM

## 2022-11-25 ENCOUNTER — Ambulatory Visit: Payer: BC Managed Care – PPO | Admitting: Rehabilitation

## 2022-11-25 DIAGNOSIS — Z9889 Other specified postprocedural states: Secondary | ICD-10-CM | POA: Diagnosis not present

## 2022-11-25 DIAGNOSIS — N644 Mastodynia: Secondary | ICD-10-CM

## 2022-11-25 DIAGNOSIS — M25612 Stiffness of left shoulder, not elsewhere classified: Secondary | ICD-10-CM

## 2022-11-25 NOTE — Therapy (Signed)
OUTPATIENT PHYSICAL THERAPY  UPPER EXTREMITY ONCOLOGY   Patient Name: Michelle [name] MRN: [id number] DOB:[birth year], 22 y.o., female Today's Date: 11/25/2022  END OF SESSION:  PT End of Session - 11/25/22 1400     Visit Number 3    Number of Visits 9    Date for PT Re-Evaluation 12/16/22    PT Start Time 1402    PT Stop Time 1450    PT Time Calculation (min) 48 min    Activity Tolerance Patient tolerated treatment well    Behavior During Therapy [facility] for tasks assessed/performed             Past Medical History:  Diagnosis Date   Anxiety    Depression    H/O seasonal allergies    Past Surgical History:  Procedure Laterality Date   BREAST REDUCTION SURGERY Bilateral 09/10/2022   Procedure: MAMMARY REDUCTION  (BREAST);  Surgeon: Santiago Glad, MD;  Location: [facility];  Service: Plastics;  Laterality: Bilateral;   WISDOM TOOTH EXTRACTION     Patient Active Problem List   Diagnosis Date Noted   Morbid obesity (HCC) 02/20/2020   Hyperinsulinemia 01/02/2019   Acanthosis nigricans 01/01/2019   Anxiety 01/01/2019   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   PMDD (premenstrual dysphoric disorder) 01/24/2017    PCP: Lia Hopping, MD  REFERRING PROVIDER: Evelena Leyden PA-C  REFERRING DIAG: [phone] (ICD-10-CM) - S/P bilateral breast reduction   THERAPY DIAG:  Breast pain, left  H/O bilateral breast reduction surgery  Stiffness of left shoulder, not elsewhere classified  ONSET DATE: 09/10/22  Rationale for Evaluation and Treatment: Rehabilitation  SUBJECTIVE:                                                                                                                                                                                           SUBJECTIVE STATEMENT: I did the stair master and then the TM and it didn't hurt afterwards at all.  I have also been sleeping on my sides   EVAL: It is just the left side.  It is like  things like reaching up I get a muscle pain.  I also get some mid back pain (around scapula) I feel like the left breast is maybe a bit higher.  They did say to wait 3-6 months to see if it settles.    PERTINENT HISTORY: bil breast reduction 09/10/22.   PAIN:  Are you having pain? No Only when reaching up Left anterior breast/pectoralis region - not axilla or shoulder  PRECAUTIONS: None  WEIGHT BEARING RESTRICTIONS: No  FALLS:  Has patient fallen in last 6 months?  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   OBSERVATIONS / OTHER ASSESSMENTS: breasts well healed and symmetrical in seated, in supine the left anterior chest/breast appears larger.   POSTURE: WNL  UPPER EXTREMITY AROM/PROM:  A/PROM RIGHT   eval   Shoulder extension 50  Shoulder flexion 155  Shoulder abduction 170  Shoulder internal rotation   Shoulder external rotation 80    (Blank rows = not tested)  A/PROM LEFT   eval 11/25/22  Shoulder extension 50   Shoulder flexion 135 - pull side of breast 155  Shoulder abduction 165 - pull in axilla  165  Shoulder internal rotation    Shoulder external rotation 80     (Blank rows = not tested)  CERVICAL AROM: All within normal limits: reports some tension due to sleeping on her back  UPPER EXTREMITY STRENGTH:   QUICK DASH SURVEY: 56%   TODAY'S TREATMENT:                                                                                                                                           DATE:  11/25/22 Rechecked AROM  Lt shoulder PROM to tolerance STM/MFR Lt axilla, pectoralis, lat in arm propped overhead position MLD towards axilla and clavicle Pulleys x into flexion and abduction each with instruction - slight pain with end range pulley  Ball flexion x 6 bil Supine snow angel x 6 Alternating flexion x 6  Alternating horizontal abd x 6   11/20/22 Lt shoulder PROM to tolerance STM/MFR Lt axilla, pectoralis, lat in arm propped overhead position MLD towards axilla and clavicle Pulleys x into flexion and abduction each with instruction Supine dowel flexion x 6   11/18/22 Eval performed Supine PROM - general MLD towards axilla on the Lt: axillary nodes, clavicular nodes and lateral trunk - edu on this for home. STM Lt pectoralis Performance of HEP per below with instruction on each  PATIENT EDUCATION:  Education details: initial HEP Person educated: Patient Education method: Explanation, Demonstration, and Tactile cues Education comprehension: verbalized understanding and returned demonstration  HOME EXERCISE PROGRAM: Access Code: [email or web address] URL: [email or web address] Date: 11/18/2022 Prepared by: Gwenevere Abbot  Exercises - Supine Chest Stretch with Elbows Bent  - 1 x daily - 7 x weekly - 1 sets - 3 reps - 30-60seconds hold - Supine Lower Trunk Rotation  - 1 x daily - 7 x weekly - 1-3 sets - 10 reps -  20-30 seconds hold - Single Arm Doorway Pec Stretch at 90 Degrees Abduction  - 1 x daily - 7 x weekly - 10 reps - 20-30 second hold - Standing Shoulder Abduction Slides at Wall  - 1 x daily - 7 x weekly - 1-3 sets - 10 reps - 20-30 seconds hold  ASSESSMENT:  CLINICAL IMPRESSION: Pt is doing better already.  She feels like her AROM is now equal but does not some difficulty end range.   OBJECTIVE IMPAIRMENTS: decreased activity tolerance, decreased ROM, impaired UE functional use, and pain.   ACTIVITY LIMITATIONS: carrying, lifting, and  reach over head  PARTICIPATION LIMITATIONS: cleaning and occupation  PERSONAL FACTORS: none are also affecting patient's functional outcome.   REHAB POTENTIAL: Excellent  CLINICAL DECISION MAKING: Stable/uncomplicated  EVALUATION COMPLEXITY: Low  GOALS: Goals reviewed with patient? Yes  SHORT TERM GOALS: Target date: 11/18/22   Pt will be ind with initial HEP Baseline: Goal status: MET   LONG TERM GOALS: Target date: 12/23/22  Pt will improve AROM to full on the left without restriction Baseline:  Goal status: INITIAL  2.  Pt will return to gym activities without restriction Baseline:  Goal status: INITIAL  PLAN:  PT FREQUENCY: 1-2x/week  PT DURATION: 4 weeks  PLANNED INTERVENTIONS: Therapeutic exercises, Patient/Family education, Self Care, Manual therapy, and Re-evaluation  PLAN FOR NEXT SESSION: Left PROM/AAROM, STM pec, return to gym activities and lifting  Idamae Lusher, PT 11/25/2022, 2:51 PM

## 2022-11-26 ENCOUNTER — Ambulatory Visit (INDEPENDENT_AMBULATORY_CARE_PROVIDER_SITE_OTHER): Payer: BC Managed Care – PPO | Admitting: Physician Assistant

## 2022-11-26 DIAGNOSIS — Z9889 Other specified postprocedural states: Secondary | ICD-10-CM

## 2022-11-26 NOTE — Progress Notes (Signed)
Patient is a pleasant 22 year old female with PMH of macromastia s/p bilateral breast reduction performed 09/10/2022 by Dr. Ladona Ridgel who joins via telephone for postoperative follow-up.  She was last seen here in clinic on 11/07/2022.  At that time, breasts had excellent shape and symmetry and exam was entirely benign.  She did complain of some tightness with certain movements on the left side, but improved with gentle massage.  No obvious aggravating injury or event.  Discussed referral to PT given likely muscular component.  She also complained of some excess lateral breast tissue bilaterally, but encouraged her to follow-up with Dr. Ladona Ridgel in 3 months if it has not yet settled and is still bothersome.  The patient, Michelle [name], gave permission to have this encounter performed via telemedicine, two identifiers were used to confirm patient's identity.  They also consented to chart review and treatment via this encounter.  The patient was at home and this provider was calling from their office.  A total of 12 minutes was spent speaking with the patient and reviewing chart.    Today, patient is doing well.  She states that she is in her second week of physical therapy and it has been "amazing".  She states that massages and stretches have helped considerably and she is now able to go to the gym.  She had only mild discomfort in the bilateral breasts upon using stairmaster and plans to increase activity as tolerated.  She also states that she is now sleeping on her sides and inquires about ongoing activity restrictions as well as compressive garments.  Recommend that she increase her activity as tolerated, no specific gym restrictions at this time.  As for her bras, she can wear normal bras aside from underwire's.  Prefer that she hold off on underwire bras for at least 2 months to help mitigate risk of aggravating her inframammary incisions.  She denies any wounds so there are no ongoing restrictions in terms of  submerging her breasts in water.  Discussed silicone scar gels given that all of her residual Dermabond is gone.  Recommended twice daily x 3 months.  She reports that she has an appointment with Dr. Ladona Ridgel in 3 months to discuss excess lateral breast tissue bilaterally.  She states that it is still mildly bothersome, but improved compared to last encounter.  Suspected will continue to improve with time as well as gentle massage.  She will call the clinic should she have any questions or concerns in the interim.

## 2022-12-02 ENCOUNTER — Encounter: Payer: Self-pay | Admitting: Rehabilitation

## 2022-12-02 ENCOUNTER — Ambulatory Visit: Payer: BC Managed Care – PPO | Admitting: Rehabilitation

## 2022-12-02 DIAGNOSIS — Z9889 Other specified postprocedural states: Secondary | ICD-10-CM

## 2022-12-02 DIAGNOSIS — N644 Mastodynia: Secondary | ICD-10-CM

## 2022-12-02 DIAGNOSIS — M25612 Stiffness of left shoulder, not elsewhere classified: Secondary | ICD-10-CM

## 2022-12-02 NOTE — Therapy (Unsigned)
OUTPATIENT PHYSICAL THERAPY  UPPER EXTREMITY ONCOLOGY   Patient Name: Michelle [name] MRN: [id number] DOB:01-Apr-2001, 22 y.o., female Today's Date: 12/03/2022  END OF SESSION:  PT End of Session - 12/02/22 1357     Visit Number 4    Number of Visits 9    Date for PT Re-Evaluation 12/16/22    PT Start Time 1400    PT Stop Time 1453    PT Time Calculation (min) 53 min    Activity Tolerance Patient tolerated treatment well    Behavior During Therapy [facility] for tasks assessed/performed             Past Medical History:  Diagnosis Date   Anxiety    Depression    H/O seasonal allergies    Past Surgical History:  Procedure Laterality Date   BREAST REDUCTION SURGERY Bilateral 09/10/2022   Procedure: MAMMARY REDUCTION  (BREAST);  Surgeon: Santiago Glad, MD;  Location: [facility];  Service: Plastics;  Laterality: Bilateral;   WISDOM TOOTH EXTRACTION     Patient Active Problem List   Diagnosis Date Noted   Morbid obesity (HCC) 02/20/2020   Hyperinsulinemia 01/02/2019   Acanthosis nigricans 01/01/2019   Anxiety 01/01/2019   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   PMDD (premenstrual dysphoric disorder) 01/24/2017    PCP: Lia Hopping, MD  REFERRING PROVIDER: Evelena Leyden PA-C  REFERRING DIAG: [phone] (ICD-10-CM) - S/P bilateral breast reduction   THERAPY DIAG:  Breast pain, left  H/O bilateral breast reduction surgery  Stiffness of left shoulder, not elsewhere classified  ONSET DATE: 09/10/22  Rationale for Evaluation and Treatment: Rehabilitation  SUBJECTIVE:                                                                                                                                                                                           SUBJECTIVE STATEMENT: I was cleared for all things by Gerre Pebbles.     EVAL: It is just the left side.  It is like things like reaching up I get a muscle pain.  I also get some mid back pain  (around scapula) I feel like the left breast is maybe a bit higher.  They did say to wait 3-6 months to see if it settles.    PERTINENT HISTORY: bil breast reduction 09/10/22.   PAIN:  Are you having pain? No Only when reaching up Left anterior breast/pectoralis region - not axilla or shoulder  PRECAUTIONS: None  WEIGHT BEARING RESTRICTIONS: No  FALLS:  Has patient fallen in last 6 months? 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   OBSERVATIONS / OTHER ASSESSMENTS: breasts well healed and symmetrical in seated, in supine the left anterior chest/breast appears larger.   POSTURE: WNL  UPPER EXTREMITY AROM/PROM:  A/PROM RIGHT   eval   Shoulder extension 50  Shoulder flexion 155  Shoulder abduction 170  Shoulder internal rotation   Shoulder external rotation 80    (Blank rows = not tested)  A/PROM LEFT   eval 11/25/22 12/02/22  Shoulder extension 50    Shoulder flexion 135 - pull side of breast 155 155  Shoulder abduction 165 - pull in axilla  165 170  Shoulder internal rotation     Shoulder external rotation 80      (Blank rows = not tested)  CERVICAL AROM: All within normal limits: reports some tension due to sleeping on her back  UPPER EXTREMITY STRENGTH:   QUICK DASH SURVEY: 56%   TODAY'S TREATMENT:                                                                                                                                          DATE:  12/02/22 Rechecked AROM  Pulleys x into  flexion and abduction Supine snow angel x 6 - to tolerance  Alternating flexion x 6  Alternating horizontal abd x 6  Pro/ret with tcs/demo Supine scap yellow x 5 with tcs/vcs and demo for each - added to HEP 2# flexion x 10 back against wall  Lt shoulder PROM to tolerance STM/MFR Lt axilla, pectoralis, lat in arm propped overhead position MLD towards axilla and clavicle - more lengthy axillary clearance today with improvements in symptoms  11/25/22 Rechecked AROM  Lt shoulder PROM to tolerance STM/MFR Lt axilla, pectoralis, lat in arm propped overhead position MLD towards axilla and clavicle Pulleys x into flexion and abduction each with instruction - slight pain with end range pulley  Ball flexion x 6 bil Supine snow angel x 6 Alternating flexion x 6  Alternating horizontal abd x 6   11/20/22 Lt shoulder PROM to tolerance STM/MFR Lt axilla, pectoralis, lat in arm propped overhead position MLD towards axilla and clavicle Pulleys x into flexion and abduction each with instruction Supine dowel flexion x 6   11/18/22 Eval performed Supine PROM - general MLD towards axilla on the Lt: axillary nodes, clavicular nodes and lateral trunk - edu on this for home. STM Lt pectoralis Performance of HEP per below with instruction on each  PATIENT EDUCATION:  Education details: initial HEP Person  educated: Patient Education method: Explanation, Demonstration, and Tactile cues Education comprehension: verbalized understanding and returned demonstration  HOME EXERCISE PROGRAM: Access Code: [email or web address] URL: [email or web address] Date: 11/18/2022 Prepared by: Gwenevere Abbot  Exercises - Supine Chest Stretch with Elbows Bent  - 1 x daily - 7 x weekly - 1 sets - 3 reps - 30-60seconds hold - Supine Lower Trunk Rotation  - 1 x daily - 7 x weekly - 1-3 sets - 10 reps - 20-30 seconds hold - Single Arm Doorway Pec Stretch at 90 Degrees Abduction  - 1 x daily - 7 x weekly - 10  reps - 20-30 second hold - Standing Shoulder Abduction Slides at Wall  - 1 x daily - 7 x weekly - 1-3 sets - 10 reps - 20-30 seconds hold  Access Code: [email or web address] URL: [email or web address] Date: 12/02/2022 Prepared by: Gwenevere Abbot  Exercises - Supine Shoulder Horizontal Abduction with Resistance  - 1 x daily - 3 x weekly - 1-3 sets - 10 reps - Supine Shoulder External Rotation with Resistance  - 1 x daily - 3 x weekly - 1-3 sets - 10 reps - Supine PNF D2 Flexion with Resistance  - 1 x daily - 3 x weekly - 1-3 sets - 10 reps - Standing Shoulder Row with Anchored Resistance  - 1 x daily - 3 x weekly - 1-3 sets - 10 reps - Standing Shoulder Flexion to 90 Degrees with Dumbbells  - 1 x daily - 3 x weekly - 1-3 sets - 10 reps - no hold  ASSESSMENT:  CLINICAL IMPRESSION: Pt is having intermittent pain in the breast and pain with reaching to lift on the left only.  She had symptom relief with more MLD focus today which was encouraging.    OBJECTIVE IMPAIRMENTS: decreased activity tolerance, decreased ROM, impaired UE functional use, and pain.   ACTIVITY LIMITATIONS: carrying, lifting, and reach over head  PARTICIPATION LIMITATIONS: cleaning and occupation  PERSONAL FACTORS: none are also affecting patient's functional outcome.   REHAB POTENTIAL: Excellent  CLINICAL DECISION MAKING: Stable/uncomplicated  EVALUATION COMPLEXITY: Low  GOALS: Goals reviewed with patient? Yes  SHORT TERM GOALS: Target date: 11/18/22   Pt will be ind with initial HEP Baseline: Goal status: MET   LONG TERM GOALS: Target date: 12/23/22  Pt will improve AROM to full on the left without restriction Baseline:  Goal status: INITIAL  2.  Pt will return to gym activities without restriction Baseline:  Goal status: INITIAL  PLAN:  PT FREQUENCY: 1-2x/week  PT DURATION: 4 weeks  PLANNED INTERVENTIONS: Therapeutic exercises, Patient/Family education, Self Care, Manual therapy, and  Re-evaluation  PLAN FOR NEXT SESSION: Left PROM/AAROM, STM pec, return to gym activities and lifting  Idamae Lusher, PT 12/03/2022, 3:28 PM

## 2022-12-04 ENCOUNTER — Ambulatory Visit: Payer: BC Managed Care – PPO | Admitting: Rehabilitation

## 2022-12-04 DIAGNOSIS — N644 Mastodynia: Secondary | ICD-10-CM

## 2022-12-04 DIAGNOSIS — Z9889 Other specified postprocedural states: Secondary | ICD-10-CM | POA: Diagnosis not present

## 2022-12-04 DIAGNOSIS — M25612 Stiffness of left shoulder, not elsewhere classified: Secondary | ICD-10-CM

## 2022-12-04 NOTE — Therapy (Signed)
OUTPATIENT PHYSICAL THERAPY  UPPER EXTREMITY ONCOLOGY   Patient Name: Michelle [name] MRN: [id number] DOB:December 21, 2000, 22 y.o., female Today's Date: 12/04/2022  END OF SESSION:  PT End of Session - 12/04/22 1652     Visit Number 5    Number of Visits 9    Date for PT Re-Evaluation 12/16/22    PT Start Time 1600    PT Stop Time 1649    PT Time Calculation (min) 49 min    Activity Tolerance Patient tolerated treatment well    Behavior During Therapy [facility] for tasks assessed/performed             Past Medical History:  Diagnosis Date   Anxiety    Depression    H/O seasonal allergies    Past Surgical History:  Procedure Laterality Date   BREAST REDUCTION SURGERY Bilateral 09/10/2022   Procedure: MAMMARY REDUCTION  (BREAST);  Surgeon: Santiago Glad, MD;  Location: [facility];  Service: Plastics;  Laterality: Bilateral;   WISDOM TOOTH EXTRACTION     Patient Active Problem List   Diagnosis Date Noted   Morbid obesity (HCC) 02/20/2020   Hyperinsulinemia 01/02/2019   Acanthosis nigricans 01/01/2019   Anxiety 01/01/2019   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   PMDD (premenstrual dysphoric disorder) 01/24/2017    PCP: Lia Hopping, MD  REFERRING PROVIDER: Evelena Leyden PA-C  REFERRING DIAG: [phone] (ICD-10-CM) - S/P bilateral breast reduction   THERAPY DIAG:  Breast pain, left  H/O bilateral breast reduction surgery  Stiffness of left shoulder, not elsewhere classified  ONSET DATE: 09/10/22  Rationale for Evaluation and Treatment: Rehabilitation  SUBJECTIVE:                                                                                                                                                                                           SUBJECTIVE STATEMENT: Nothing new  EVAL: It is just the left side.  It is like things like reaching up I get a muscle pain.  I also get some mid back pain (around scapula) I feel like the  left breast is maybe a bit higher.  They did say to wait 3-6 months to see if it settles.    PERTINENT HISTORY: bil breast reduction 09/10/22.   PAIN:  Are you having pain? No Only when reaching up Left anterior breast/pectoralis region - not axilla or shoulder  PRECAUTIONS: None  WEIGHT BEARING RESTRICTIONS: No  FALLS:  Has patient fallen in last 6 months? 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   OBSERVATIONS / OTHER ASSESSMENTS: breasts well healed and symmetrical in seated, in supine the left anterior chest/breast appears larger.   POSTURE: WNL  UPPER EXTREMITY AROM/PROM:  A/PROM RIGHT   eval   Shoulder extension 50  Shoulder flexion 155  Shoulder abduction 170  Shoulder internal rotation   Shoulder external rotation 80    (Blank rows = not tested)  A/PROM LEFT   eval 11/25/22 12/02/22  Shoulder extension 50    Shoulder flexion 135 - pull side of breast 155 155  Shoulder abduction 165 - pull in axilla  165 170  Shoulder internal rotation     Shoulder external rotation 80      (Blank rows = not tested)  CERVICAL AROM: All within normal limits: reports some tension due to sleeping on her back  UPPER EXTREMITY STRENGTH:   QUICK DASH SURVEY: 56%   TODAY'S TREATMENT:                                                                                                                                          DATE:  12/04/22 Focused on MLD today as pt's biggest complaint is congestion in the  Left and Rt breast Intact sequence both sides: working bil breast towards nearest lymph nodes Focus on Left lateral breast and axilla.  Took photo to send to Evelena Leyden due to consistent redness just to double check they are aware of it as pt states.    12/02/22 Rechecked AROM  Pulleys x into flexion and abduction Supine snow angel x 6 - to tolerance  Alternating flexion x 6  Alternating horizontal abd x 6  Pro/ret with tcs/demo Supine scap yellow x 5 with tcs/vcs and demo for each - added to HEP 2# flexion x 10 back against wall  Lt shoulder PROM to tolerance STM/MFR Lt axilla, pectoralis, lat in arm propped overhead position MLD towards axilla and clavicle - more lengthy axillary clearance today with improvements in symptoms  11/25/22 Rechecked AROM  Lt shoulder PROM to tolerance STM/MFR Lt axilla, pectoralis, lat in arm propped overhead position MLD towards axilla and clavicle Pulleys x into flexion and abduction each with instruction - slight pain with end range pulley  Ball flexion x 6 bil Supine snow angel x 6 Alternating flexion x 6  Alternating horizontal abd x 6   11/20/22 Lt shoulder PROM to tolerance STM/MFR Lt axilla, pectoralis, lat in arm propped overhead position MLD towards axilla and clavicle Pulleys x into flexion and abduction each with instruction Supine  dowel flexion x 6   11/18/22 Eval performed Supine PROM - general MLD towards axilla on the Lt: axillary nodes, clavicular nodes and lateral trunk - edu on this for home. STM Lt pectoralis Performance of HEP per below with instruction on each  PATIENT EDUCATION:  Education details: initial HEP Person educated: Patient Education method: Explanation, Demonstration, and Tactile cues Education comprehension: verbalized understanding and returned demonstration  HOME EXERCISE PROGRAM: Access Code: [email or web address] URL: [email or web address] Date: 11/18/2022 Prepared by: Gwenevere Abbot  Exercises - Supine Chest Stretch with Elbows Bent  - 1 x daily - 7 x weekly - 1 sets - 3 reps - 30-60seconds hold - Supine Lower Trunk Rotation  - 1 x daily - 7 x weekly - 1-3 sets - 10 reps - 20-30 seconds hold - Single Arm Doorway Pec Stretch at 90 Degrees Abduction  - 1 x daily - 7 x weekly - 10 reps - 20-30 second hold - Standing Shoulder Abduction Slides at Wall  - 1 x daily - 7 x weekly - 1-3 sets - 10 reps - 20-30 seconds hold  Access Code: [email or web address] URL: [email or web address] Date: 12/02/2022 Prepared by: Gwenevere Abbot  Exercises - Supine Shoulder Horizontal Abduction with Resistance  - 1 x daily - 3 x weekly - 1-3 sets - 10 reps - Supine Shoulder External Rotation with Resistance  - 1 x daily - 3 x weekly - 1-3 sets - 10 reps - Supine PNF D2 Flexion with Resistance  - 1 x daily - 3 x weekly - 1-3 sets - 10 reps - Standing Shoulder Row with Anchored Resistance  - 1 x daily - 3 x weekly - 1-3 sets - 10 reps - Standing Shoulder Flexion to 90 Degrees with Dumbbells  - 1 x daily - 3 x weekly - 1-3 sets - 10 reps - no hold  ASSESSMENT:  CLINICAL IMPRESSION: Pt is having intermittent pain in the breast and pain with reaching to lift on the left only. Continued redness was photographed today and copied to Evelena Leyden to double check that it is normal as pt had been stating.   OBJECTIVE IMPAIRMENTS: decreased activity tolerance, decreased ROM, impaired UE functional use, and pain.   ACTIVITY LIMITATIONS: carrying, lifting, and reach over head  PARTICIPATION LIMITATIONS: cleaning and occupation  PERSONAL FACTORS: none are also affecting patient's functional outcome.   REHAB POTENTIAL: Excellent  CLINICAL DECISION MAKING: Stable/uncomplicated  EVALUATION COMPLEXITY: Low  GOALS: Goals reviewed with patient? Yes  SHORT TERM GOALS: Target date: 11/18/22   Pt will be ind with initial HEP Baseline: Goal status: MET   LONG TERM GOALS: Target date:  12/23/22  Pt will improve AROM to full on the left without restriction Baseline:  Goal status: INITIAL  2.  Pt will return to gym activities without restriction Baseline:  Goal status: INITIAL  PLAN:  PT FREQUENCY: 1-2x/week  PT DURATION: 4 weeks  PLANNED INTERVENTIONS: Therapeutic exercises, Patient/Family education, Self Care, Manual therapy, and Re-evaluation  PLAN FOR NEXT SESSION: Left PROM/AAROM, STM pec, return to gym activities and lifting  Idamae Lusher, PT 12/04/2022, 4:54 PM

## 2022-12-09 ENCOUNTER — Ambulatory Visit: Payer: BC Managed Care – PPO | Admitting: Rehabilitation

## 2022-12-09 ENCOUNTER — Encounter: Payer: Self-pay | Admitting: Rehabilitation

## 2022-12-09 DIAGNOSIS — N644 Mastodynia: Secondary | ICD-10-CM

## 2022-12-09 DIAGNOSIS — Z9889 Other specified postprocedural states: Secondary | ICD-10-CM | POA: Diagnosis not present

## 2022-12-09 DIAGNOSIS — M25612 Stiffness of left shoulder, not elsewhere classified: Secondary | ICD-10-CM

## 2022-12-09 NOTE — Therapy (Signed)
OUTPATIENT PHYSICAL THERAPY  UPPER EXTREMITY ONCOLOGY   Patient Name: Michelle [name] MRN: [id number] DOB:06-02-2000, 22 y.o., female Today's Date: 12/09/2022  END OF SESSION:  PT End of Session - 12/09/22 1503     Visit Number 6    Number of Visits 9    Date for PT Re-Evaluation 12/16/22    PT Start Time 1504    PT Stop Time 1542    PT Time Calculation (min) 38 min    Activity Tolerance Patient tolerated treatment well    Behavior During Therapy [facility] for tasks assessed/performed              Past Medical History:  Diagnosis Date   Anxiety    Depression    H/O seasonal allergies    Past Surgical History:  Procedure Laterality Date   BREAST REDUCTION SURGERY Bilateral 09/10/2022   Procedure: MAMMARY REDUCTION  (BREAST);  Surgeon: Santiago Glad, MD;  Location: [facility];  Service: Plastics;  Laterality: Bilateral;   WISDOM TOOTH EXTRACTION     Patient Active Problem List   Diagnosis Date Noted   Morbid obesity (HCC) 02/20/2020   Hyperinsulinemia 01/02/2019   Acanthosis nigricans 01/01/2019   Anxiety 01/01/2019   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   PMDD (premenstrual dysphoric disorder) 01/24/2017    PCP: Lia Hopping, MD  REFERRING PROVIDER: Evelena Leyden PA-C  REFERRING DIAG: [phone] (ICD-10-CM) - S/P bilateral breast reduction   THERAPY DIAG:  Breast pain, left  H/O bilateral breast reduction surgery  Stiffness of left shoulder, not elsewhere classified  ONSET DATE: 09/10/22  Rationale for Evaluation and Treatment: Rehabilitation  SUBJECTIVE:                                                                                                                                                                                           SUBJECTIVE STATEMENT: I am not good today.  I don't feel good at all.  I am still wearing compression.  I am going in to look at the redness on Tuesday.    EVAL: It is just the left  side.  It is like things like reaching up I get a muscle pain.  I also get some mid back pain (around scapula) I feel like the left breast is maybe a bit higher.  They did say to wait 3-6 months to see if it settles.    PERTINENT HISTORY: bil breast reduction 09/10/22.   PAIN:  Are you having pain? 3/10  Rt medial inferior breast near the incisions.   PRECAUTIONS: None  WEIGHT BEARING RESTRICTIONS: No  FALLS:  Has patient fallen in  last 6 months? 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   OBSERVATIONS / OTHER ASSESSMENTS: breasts well healed and symmetrical in seated, in supine the left anterior chest/breast appears larger.   POSTURE: WNL  UPPER EXTREMITY AROM/PROM:  A/PROM RIGHT   eval   Shoulder extension 50  Shoulder flexion 155  Shoulder abduction 170  Shoulder internal rotation   Shoulder external rotation 80    (Blank rows = not tested)  A/PROM LEFT   eval 11/25/22 12/02/22  Shoulder extension 50    Shoulder flexion 135 - pull side of breast 155 155  Shoulder abduction 165 - pull in axilla  165 170  Shoulder internal rotation     Shoulder external rotation 80      (Blank rows = not tested)  CERVICAL AROM: All within normal limits: reports some tension due to sleeping on her back  UPPER EXTREMITY STRENGTH:   QUICK DASH SURVEY: 56%   TODAY'S TREATMENT:                                                                                                                                           DATE:  12/09/22 Pt is noting more pain today and left breast appears more red medially today compared to last visit. - see photo pt will see them tomorrow at the MD office for redness check.   Started MLD on the left side today but was deferred this was tender - which it is usually not.   Intact sequence both sides: working bil breast towards nearest lymph nodes (Rt only)  12/04/22 Focused on MLD today as pt's biggest complaint is congestion in the Left and Rt breast Intact sequence both sides: working bil breast towards nearest lymph nodes Focus on Left lateral breast and axilla.  Took photo to send to Evelena Leyden due to consistent redness just to double check they are aware of it as pt states.    12/02/22 Rechecked AROM  Pulleys x into flexion and abduction Supine snow angel x 6 - to tolerance  Alternating flexion x 6  Alternating horizontal abd x 6  Pro/ret with tcs/demo Supine scap yellow x 5 with tcs/vcs and demo for each - added to HEP 2# flexion x 10 back against wall  Lt shoulder PROM to tolerance STM/MFR Lt axilla, pectoralis, lat in arm propped overhead position MLD towards axilla and clavicle - more lengthy axillary clearance today with improvements in  symptoms   PATIENT EDUCATION:  Education details: initial HEP Person educated: Patient Education method: Explanation, Demonstration, and Tactile cues Education comprehension: verbalized understanding and returned demonstration  HOME EXERCISE PROGRAM: Access Code: [email or web address] URL: [email or web address] Date: 11/18/2022 Prepared by: Gwenevere Abbot  Exercises - Supine Chest Stretch with Elbows Bent  - 1 x daily - 7 x weekly - 1 sets - 3 reps - 30-60seconds hold - Supine Lower Trunk Rotation  - 1 x daily - 7 x weekly - 1-3 sets - 10 reps - 20-30 seconds hold - Single Arm Doorway Pec Stretch at 90 Degrees Abduction  - 1 x daily - 7 x weekly - 10 reps - 20-30 second hold - Standing Shoulder Abduction Slides  at Wall  - 1 x daily - 7 x weekly - 1-3 sets - 10 reps - 20-30 seconds hold  Access Code: [email or web address] URL: [email or web address] Date: 12/02/2022 Prepared by: Gwenevere Abbot  Exercises - Supine Shoulder Horizontal Abduction with Resistance  - 1 x daily - 3 x weekly - 1-3 sets - 10 reps - Supine Shoulder External Rotation with Resistance  - 1 x daily - 3 x weekly - 1-3 sets - 10 reps - Supine PNF D2 Flexion with Resistance  - 1 x daily - 3 x weekly - 1-3 sets - 10 reps - Standing Shoulder Row with Anchored Resistance  - 1 x daily - 3 x weekly - 1-3 sets - 10 reps - Standing Shoulder Flexion to 90 Degrees with Dumbbells  - 1 x daily - 3 x weekly - 1-3 sets - 10 reps - no hold  ASSESSMENT:  CLINICAL IMPRESSION: Pt is having more pain and redness in the left breast and will be seeing the MD tomorrow . Held on MLD here just in case of infection. Performed to the Rt side which had some medial fibrosis.   OBJECTIVE IMPAIRMENTS: decreased activity tolerance, decreased ROM, impaired UE functional use, and pain.   ACTIVITY LIMITATIONS: carrying, lifting, and reach over head  PARTICIPATION LIMITATIONS: cleaning and occupation  PERSONAL FACTORS: none are also affecting patient's functional outcome.   REHAB POTENTIAL: Excellent  CLINICAL DECISION MAKING: Stable/uncomplicated  EVALUATION COMPLEXITY: Low  GOALS: Goals reviewed with patient? Yes  SHORT TERM GOALS: Target date: 11/18/22   Pt will be ind with initial HEP Baseline: Goal status: MET   LONG TERM GOALS: Target date: 12/23/22  Pt will improve AROM to full on the left without restriction Baseline:  Goal status: INITIAL  2.  Pt will return to gym activities without restriction Baseline:  Goal status: INITIAL  PLAN:  PT FREQUENCY: 1-2x/week  PT DURATION: 4 weeks  PLANNED INTERVENTIONS: Therapeutic exercises, Patient/Family education, Self Care, Manual therapy, and Re-evaluation  PLAN FOR NEXT SESSION: Left  PROM/AAROM, STM pec, return to gym activities and lifting  Idamae Lusher, PT 12/09/2022, 3:43 PM

## 2022-12-11 ENCOUNTER — Ambulatory Visit: Payer: BC Managed Care – PPO | Admitting: Rehabilitation

## 2022-12-11 ENCOUNTER — Ambulatory Visit (INDEPENDENT_AMBULATORY_CARE_PROVIDER_SITE_OTHER): Payer: BC Managed Care – PPO | Admitting: Surgical

## 2022-12-11 VITALS — BP 103/68 | HR 73

## 2022-12-11 DIAGNOSIS — N644 Mastodynia: Secondary | ICD-10-CM | POA: Diagnosis not present

## 2022-12-11 DIAGNOSIS — Z719 Counseling, unspecified: Secondary | ICD-10-CM

## 2022-12-11 DIAGNOSIS — N62 Hypertrophy of breast: Secondary | ICD-10-CM

## 2022-12-11 DIAGNOSIS — Z9889 Other specified postprocedural states: Secondary | ICD-10-CM

## 2022-12-11 DIAGNOSIS — L905 Scar conditions and fibrosis of skin: Secondary | ICD-10-CM

## 2022-12-11 NOTE — Progress Notes (Signed)
Patient is a 22 year old female here for follow-up after bilateral breast reduction with Dr. Ladona Ridgel on 09/10/2022.    Patient had an appointment on 11/26/2022, at that time reported some tightness with certain movements.  She has been working with physical therapy, she reports that physical therapy has been very helpful, but she does report some increased pain over the last week with ADLs such as washing dishes and folding laundry.  She reports that she has not tried to use any NSAIDs to help with pain, she reports that she does not believe that it would be helpful for her.  She reports that she is not having any infectious symptoms, denies any fevers or chills.  She does report that she has had redness of her left breast, she also reports some increased pain in the left breast along the medial aspect where she has a thickened scar.  Chaperone present on exam On exam bilateral NAC's are viable, bilateral breast incisions are intact and well-healed.  She does have a hypertrophic scar of the medial left breast which is tender.  There is no cellulitic changes of either breast.  No subcutaneous fluid collection noted.  She does have some erythema of the left breast, it does not appear infectious in etiology and appears more irritative/pressure related.   A/P:  Patient is a 22 year old female over 3 months postop from bilateral breast reduction with Dr. Ladona Ridgel in April 2024.    Recommend continuing with physical therapy for improvement of pain with ADLs.  Discussed using scar cream and massage to medial left breast hypertrophic scar.  It is unclear why she is having ongoing pain, at this point I do not see any signs of concern on exam related to her breast reduction.  The redness does not appear to be infectious in etiology.  We discussed possibility for steroid injection in the medial left breast scar to help with the hypertrophy and firmness, however recommend starting with massage.  Patient was  agreeable to the plan

## 2022-12-18 ENCOUNTER — Ambulatory Visit: Payer: BC Managed Care – PPO | Admitting: Rehabilitation

## 2022-12-19 ENCOUNTER — Encounter: Payer: Self-pay | Admitting: Rehabilitation

## 2022-12-19 ENCOUNTER — Ambulatory Visit: Payer: BC Managed Care – PPO | Attending: Physician Assistant | Admitting: Rehabilitation

## 2022-12-19 DIAGNOSIS — N644 Mastodynia: Secondary | ICD-10-CM | POA: Diagnosis present

## 2022-12-19 DIAGNOSIS — Z9889 Other specified postprocedural states: Secondary | ICD-10-CM | POA: Diagnosis present

## 2022-12-19 DIAGNOSIS — M25612 Stiffness of left shoulder, not elsewhere classified: Secondary | ICD-10-CM | POA: Diagnosis present

## 2022-12-19 NOTE — Therapy (Signed)
OUTPATIENT PHYSICAL THERAPY  UPPER EXTREMITY ONCOLOGY   Patient Name: Michelle [name] MRN: [id number] DOB:12/02/2000, 22 y.o., female Today's Date: 12/19/2022  END OF SESSION:  PT End of Session - 12/19/22 1204     Visit Number 7    Number of Visits 9    PT Start Time 1204    PT Stop Time 1257    PT Time Calculation (min) 53 min    Activity Tolerance Patient tolerated treatment well    Behavior During Therapy WFL for tasks assessed/performed              Past Medical History:  Diagnosis Date   Anxiety    Depression    H/O seasonal allergies    Past Surgical History:  Procedure Laterality Date   BREAST REDUCTION SURGERY Bilateral 09/10/2022   Procedure: MAMMARY REDUCTION  (BREAST);  Surgeon: Santiago Glad, MD;  Location: [facility];  Service: Plastics;  Laterality: Bilateral;   WISDOM TOOTH EXTRACTION     Patient Active Problem List   Diagnosis Date Noted   Morbid obesity (HCC) 02/20/2020   Hyperinsulinemia 01/02/2019   Acanthosis nigricans 01/01/2019   Anxiety 01/01/2019   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   PMDD (premenstrual dysphoric disorder) 01/24/2017    PCP: Lia Hopping, MD  REFERRING PROVIDER: Evelena Leyden PA-C  REFERRING DIAG: [phone] (ICD-10-CM) - S/P bilateral breast reduction   THERAPY DIAG:  Breast pain, left  H/O bilateral breast reduction surgery  Stiffness of left shoulder, not elsewhere classified  ONSET DATE: 09/10/22  Rationale for Evaluation and Treatment: Rehabilitation  SUBJECTIVE:                                                                                                                                                                                           SUBJECTIVE STATEMENT: My visit was okay.  They did say I had a keloid scar and I should massage it more and put the cream on.     EVAL: It is just the left side.  It is like things like reaching up I get a muscle pain.  I also  get some mid back pain (around scapula) I feel like the left breast is maybe a bit higher.  They did say to wait 3-6 months to see if it settles.    PERTINENT HISTORY: bil breast reduction 09/10/22.   PAIN:  Are you having pain? 0/10, when I'm sleeping I just get pain on the sides.    PRECAUTIONS: None  WEIGHT BEARING RESTRICTIONS: No  FALLS:  Has patient fallen in last 6 months? 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   OBSERVATIONS / OTHER ASSESSMENTS: breasts well healed and symmetrical in seated, in supine the left anterior chest/breast appears larger.   POSTURE: WNL  UPPER EXTREMITY AROM/PROM:  A/PROM RIGHT   eval   Shoulder extension 50  Shoulder flexion 155  Shoulder abduction 170  Shoulder internal rotation   Shoulder external rotation 80    (Blank rows = not tested)  A/PROM LEFT   eval 11/25/22 12/02/22  Shoulder extension 50    Shoulder flexion 135 - pull side of breast 155 155  Shoulder abduction 165 - pull in axilla  165 170  Shoulder internal rotation     Shoulder external rotation 80      (Blank rows = not tested)  CERVICAL AROM: All within normal limits: reports some tension due to sleeping on her back  UPPER EXTREMITY STRENGTH:   QUICK DASH SURVEY: 56%   TODAY'S TREATMENT:                                                                                                                                          DATE:  12/19/22 Pulleys x into flexion and  abduction Alternating flexion x 10  Alternating horizontal abd x 10 Open book bil x 3 with initial instruction Pro/ret with tcs/demo with hands on the wall seated scap yellow x 5: bil ER and bil horizonal abduction Lt shoulder PROM to tolerance MLD towards bil axilla and clavicles   12/09/22 Pt is noting more pain today and left breast appears more red medially today compared to last visit. - see photo pt will see them tomorrow at the MD office for redness check.   Started MLD on the left side today but was deferred this was tender - which it is usually not.   Intact sequence both sides: working bil breast towards nearest lymph nodes (Rt only)  12/04/22 Focused on MLD today as pt's biggest complaint is congestion in the Left and Rt breast Intact sequence both sides: working bil breast towards nearest lymph nodes Focus on Left lateral breast and axilla.  Took photo to send to Evelena Leyden due to consistent redness just to double check they are aware of it as pt states.    12/02/22 Rechecked AROM  Pulleys x into flexion and abduction Supine snow angel x 6 - to tolerance  Alternating flexion x 6  Alternating horizontal abd x 6  Pro/ret with tcs/demo Supine scap yellow x  5 with tcs/vcs and demo for each - added to HEP 2# flexion x 10 back against wall  Lt shoulder PROM to tolerance STM/MFR Lt axilla, pectoralis, lat in arm propped overhead position MLD towards axilla and clavicle - more lengthy axillary clearance today with improvements in symptoms   PATIENT EDUCATION:  Education details: initial HEP Person educated: Patient Education method: Explanation, Demonstration, and Tactile cues Education comprehension: verbalized understanding and returned demonstration  HOME EXERCISE PROGRAM: Access Code: [email or web address] URL: [email or web address] Date: 11/18/2022 Prepared by: Gwenevere Abbot  Exercises - Supine Chest Stretch with Elbows Bent  - 1 x daily - 7 x weekly - 1  sets - 3 reps - 30-60seconds hold - Supine Lower Trunk Rotation  - 1 x daily - 7 x weekly - 1-3 sets - 10 reps - 20-30 seconds hold - Single Arm Doorway Pec Stretch at 90 Degrees Abduction  - 1 x daily - 7 x weekly - 10 reps - 20-30 second hold - Standing Shoulder Abduction Slides at Wall  - 1 x daily - 7 x weekly - 1-3 sets - 10 reps - 20-30 seconds hold  Access Code: [email or web address] URL: [email or web address] Date: 12/02/2022 Prepared by: Gwenevere Abbot  Exercises - Supine Shoulder Horizontal Abduction with Resistance  - 1 x daily - 3 x weekly - 1-3 sets - 10 reps - Supine Shoulder External Rotation with Resistance  - 1 x daily - 3 x weekly - 1-3 sets - 10 reps - Supine PNF D2 Flexion with Resistance  - 1 x daily - 3 x weekly - 1-3 sets - 10 reps - Standing Shoulder Row with Anchored Resistance  - 1 x daily - 3 x weekly - 1-3 sets - 10 reps - Standing Shoulder Flexion to 90 Degrees with Dumbbells  - 1 x daily - 3 x weekly - 1-3 sets - 10 reps - no hold  ASSESSMENT:  CLINICAL IMPRESSION: Breast is less red overall and looks much better although she now has a new spot at the left medial nipple that has the appearance of a pencil eraser sized blister, pt will keep a watch on it.  She tolerated treatment well.    OBJECTIVE IMPAIRMENTS: decreased activity tolerance, decreased ROM, impaired UE functional use, and pain.   ACTIVITY LIMITATIONS: carrying, lifting, and reach over head  PARTICIPATION LIMITATIONS: cleaning and occupation  PERSONAL FACTORS: none are also affecting patient's functional outcome.   REHAB POTENTIAL: Excellent  CLINICAL DECISION MAKING: Stable/uncomplicated  EVALUATION COMPLEXITY: Low  GOALS: Goals reviewed with patient? Yes  SHORT TERM GOALS: Target date: 11/18/22   Pt will be ind with initial HEP Baseline: Goal status: MET   LONG TERM GOALS: Target date: 12/23/22  Pt will improve AROM to full on the left without restriction Baseline:  Goal status:  INITIAL  2.  Pt will return to gym activities without restriction Baseline:  Goal status: INITIAL  PLAN:  PT FREQUENCY: 1-2x/week  PT DURATION: 4 weeks  PLANNED INTERVENTIONS: Therapeutic exercises, Patient/Family education, Self Care, Manual therapy, and Re-evaluation  PLAN FOR NEXT SESSION: Left PROM/AAROM, STM pec, return to gym activities and lifting  Idamae Lusher, PT 12/19/2022, 3:30 PM

## 2022-12-30 ENCOUNTER — Encounter: Payer: Self-pay | Admitting: Rehabilitation

## 2022-12-30 ENCOUNTER — Ambulatory Visit: Payer: BC Managed Care – PPO | Admitting: Rehabilitation

## 2022-12-30 DIAGNOSIS — N644 Mastodynia: Secondary | ICD-10-CM | POA: Diagnosis not present

## 2022-12-30 DIAGNOSIS — Z9889 Other specified postprocedural states: Secondary | ICD-10-CM

## 2022-12-30 DIAGNOSIS — M25612 Stiffness of left shoulder, not elsewhere classified: Secondary | ICD-10-CM

## 2022-12-30 NOTE — Therapy (Signed)
OUTPATIENT PHYSICAL THERAPY  UPPER EXTREMITY ONCOLOGY   Patient Name: Michelle [name] MRN: [id number] DOB:[birth year], 22 y.o., female Today's Date: 12/30/2022  END OF SESSION:  PT End of Session - 12/30/22 1113     Visit Number 8    Number of Visits 16    Date for PT Re-Evaluation 01/27/23    PT Start Time 1102    PT Stop Time 1200    PT Time Calculation (min) 58 min    Activity Tolerance Patient tolerated treatment well    Behavior During Therapy WFL for tasks assessed/performed               Past Medical History:  Diagnosis Date   Anxiety    Depression    H/O seasonal allergies    Past Surgical History:  Procedure Laterality Date   BREAST REDUCTION SURGERY Bilateral 09/10/2022   Procedure: MAMMARY REDUCTION  (BREAST);  Surgeon: Santiago Glad, MD;  Location: [facility];  Service: Plastics;  Laterality: Bilateral;   WISDOM TOOTH EXTRACTION     Patient Active Problem List   Diagnosis Date Noted   Morbid obesity (HCC) 02/20/2020   Hyperinsulinemia 01/02/2019   Acanthosis nigricans 01/01/2019   Anxiety 01/01/2019   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   PMDD (premenstrual dysphoric disorder) 01/24/2017    PCP: Lia Hopping, MD  REFERRING PROVIDER: Evelena Leyden PA-C  REFERRING DIAG: [phone] (ICD-10-CM) - S/P bilateral breast reduction   THERAPY DIAG:  Breast pain, left - Plan: PT plan of care cert/re-cert  H/O bilateral breast reduction surgery - Plan: PT plan of care cert/re-cert  Stiffness of left shoulder, not elsewhere classified - Plan: PT plan of care cert/re-cert  ONSET DATE: 09/10/22  Rationale for Evaluation and Treatment: Rehabilitation  SUBJECTIVE:                                                                                                                                                                                           SUBJECTIVE STATEMENT: I have been having shoulder pain on the Rt side when  I sleep because my neck is a bit sore.  I feel really tense at the incisions at the bottom of both breasts.    EVAL: It is just the left side.  It is like things like reaching up I get a muscle pain.  I also get some mid back pain (around scapula) I feel like the left breast is maybe a bit higher.  They did say to wait 3-6 months to see if it settles.    PERTINENT HISTORY: bil breast reduction 09/10/22.   PAIN:  Are you having  pain? 0/10, when I'm sleeping I just get pain on the sides.    PRECAUTIONS: None  WEIGHT BEARING RESTRICTIONS: No  FALLS:  Has patient fallen in last 6 months? 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   OBSERVATIONS / OTHER ASSESSMENTS: breasts well healed and symmetrical in seated, in supine the left anterior chest/breast appears larger.   POSTURE: WNL  UPPER EXTREMITY AROM/PROM:  A/PROM RIGHT   eval   Shoulder extension 50  Shoulder flexion 155  Shoulder abduction 170  Shoulder internal rotation   Shoulder external rotation 80    (Blank rows = not tested)  A/PROM LEFT   eval 11/25/22 12/02/22  Shoulder extension 50    Shoulder flexion 135 - pull side of breast 155 155  Shoulder abduction 165 - pull in axilla  165 170  Shoulder internal rotation     Shoulder external rotation 80      (Blank rows = not tested)  CERVICAL AROM: All within normal limits: reports some tension due to sleeping on her back  UPPER EXTREMITY STRENGTH:   QUICK DASH SURVEY: 56%on eval, 31% on  12/30/22  BREAST COMPLAINTS QUESTIONNAIRE on 12/30/22 Pain:   3 Heaviness:  2 Swollen feeling:  2 Tense Skin:  5 Redness:  5 Bra Print:  5 Size of Pores:  0 Hard feeling:   5 Total:     27/80 A Score over 9 indicates lymphedema issues in the breast   TODAY'S TREATMENT:                                                                                                                                          DATE:  12/30/22 Reassessment of goals and POC extension Pulleys x into flexion and abduction Wall ball flexion and abduction bil 5x6"  Supine: Alternating flexion x 10  Alternating horizontal abd x 10 Pro/Ret x 10 with vcs to keep the neck relaxed yellow x 10: bil ER and bil horizonal abduction with vcs to increase speed and relax the neck, diagonals x 10 bil Open book  x 3 bil with vcs to not focus on breast feeling as much and just perform the motion at normal speed MLD towards bil axilla and clavicles   12/19/22 Pulleys x into flexion and abduction Alternating flexion x 10  Alternating horizontal abd x 10 Open book bil x 3 with initial instruction Pro/ret with tcs/demo with hands on the wall seated  scap yellow x 5: bil ER and bil horizonal abduction Lt shoulder PROM to tolerance MLD towards bil axilla and clavicles   12/09/22 Pt is noting more pain today and left breast appears more red medially today compared to last visit. - see photo pt will see them tomorrow at the MD office for redness check.   Started MLD on the left side today but was deferred this was tender - which it is usually not.   Intact sequence both sides: working bil breast towards nearest lymph nodes (Rt only)  PATIENT EDUCATION:  Education details: initial HEP Person educated: Patient Education method: Explanation, Demonstration, and Tactile cues Education comprehension: verbalized understanding and returned demonstration  HOME EXERCISE PROGRAM: Access Code: [email or web address] URL:  [email or web address] Date: 11/18/2022 Prepared by: Gwenevere Abbot  Exercises - Supine Chest Stretch with Elbows Bent  - 1 x daily - 7 x weekly - 1 sets - 3 reps - 30-60seconds hold - Supine Lower Trunk Rotation  - 1 x daily - 7 x weekly - 1-3 sets - 10 reps - 20-30 seconds hold - Single Arm Doorway Pec Stretch at 90 Degrees Abduction  - 1 x daily - 7 x weekly - 10 reps - 20-30 second hold - Standing Shoulder Abduction Slides at Wall  - 1 x daily - 7 x weekly - 1-3 sets - 10 reps - 20-30 seconds hold  Access Code: [email or web address] URL: [email or web address] Date: 12/02/2022 Prepared by: Gwenevere Abbot  Exercises - Supine Shoulder Horizontal Abduction with Resistance  - 1 x daily - 3 x weekly - 1-3 sets - 10 reps - Supine Shoulder External Rotation with Resistance  - 1 x daily - 3 x weekly - 1-3 sets - 10 reps - Supine PNF D2 Flexion with Resistance  - 1 x daily - 3 x weekly - 1-3 sets - 10 reps - Standing Shoulder Row with Anchored Resistance  - 1 x daily - 3 x weekly - 1-3 sets - 10 reps - Standing Shoulder Flexion to 90 Degrees with Dumbbells  - 1 x daily - 3 x weekly - 1-3 sets - 10 reps - no hold  ASSESSMENT:  CLINICAL IMPRESSION: Pt is doing much better with TE today.  Still slow and a bit hesitant but improved with cueing.  Extended POC x 4 more weeks to work on return to gym activities and continued swelling.  Redness and pain seem to come and go.   OBJECTIVE IMPAIRMENTS: decreased activity tolerance, decreased ROM, impaired UE functional use, and pain.   ACTIVITY LIMITATIONS: carrying, lifting, and reach over head  PARTICIPATION LIMITATIONS: cleaning and occupation  PERSONAL FACTORS: none are also affecting patient's functional outcome.   REHAB POTENTIAL: Excellent  CLINICAL DECISION MAKING: Stable/uncomplicated  EVALUATION COMPLEXITY: Low  GOALS: Goals reviewed with patient? Yes  SHORT TERM GOALS: Target date: 11/18/22   Pt will be ind with initial  HEP Baseline: Goal status: MET   LONG TERM GOALS: Target date: 9/16 /24  Pt will improve AROM to full on the left without restriction Baseline:  Goal status: MET  2.  Pt will return to gym activities without restriction Baseline:  Goal status: ONGOING - has not returned to weights but is back to doing cardio and stairmaster  3.  Pt will decrease QDASH to 22% or less  NEW  4. Pt will decrease pain at the inferior breast incisions to 1/10 or less with touch  4/10 baseline  NEW  PLAN:  PT FREQUENCY: 1-2x/week  PT DURATION: 4  weeks  PLANNED INTERVENTIONS: Therapeutic exercises, Patient/Family education, Self Care, Manual therapy, and Re-evaluation, Dry Needling, taping  PLAN FOR NEXT SESSION: Left PROM/AAROM, STM pec, return to gym activities and lifting  Idamae Lusher, PT 12/30/2022, 12:01 PM

## 2023-01-01 ENCOUNTER — Encounter: Payer: Self-pay | Admitting: Rehabilitation

## 2023-01-01 ENCOUNTER — Ambulatory Visit: Payer: BC Managed Care – PPO | Admitting: Rehabilitation

## 2023-01-01 DIAGNOSIS — N644 Mastodynia: Secondary | ICD-10-CM

## 2023-01-01 DIAGNOSIS — Z9889 Other specified postprocedural states: Secondary | ICD-10-CM

## 2023-01-01 DIAGNOSIS — M25612 Stiffness of left shoulder, not elsewhere classified: Secondary | ICD-10-CM

## 2023-01-01 NOTE — Therapy (Signed)
OUTPATIENT PHYSICAL THERAPY  UPPER EXTREMITY ONCOLOGY   Patient Name: Michelle [name] MRN: [id number] DOB:2000/09/09, 22 y.o., female Today's Date: 01/01/2023  END OF SESSION:  PT End of Session - 01/01/23 1012     Visit Number 9    Number of Visits 16    Date for PT Re-Evaluation 01/27/23    PT Start Time 1113   running late   PT Stop Time 1156    PT Time Calculation (min) 43 min    Activity Tolerance Patient tolerated treatment well    Behavior During Therapy [facility] for tasks assessed/performed               Past Medical History:  Diagnosis Date   Anxiety    Depression    H/O seasonal allergies    Past Surgical History:  Procedure Laterality Date   BREAST REDUCTION SURGERY Bilateral 09/10/2022   Procedure: MAMMARY REDUCTION  (BREAST);  Surgeon: Santiago Glad, MD;  Location: [facility];  Service: Plastics;  Laterality: Bilateral;   WISDOM TOOTH EXTRACTION     Patient Active Problem List   Diagnosis Date Noted   Morbid obesity (HCC) 02/20/2020   Hyperinsulinemia 01/02/2019   Acanthosis nigricans 01/01/2019   Anxiety 01/01/2019   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   PMDD (premenstrual dysphoric disorder) 01/24/2017    PCP: Lia Hopping, MD  REFERRING PROVIDER: Evelena Leyden PA-C  REFERRING DIAG: [phone] (ICD-10-CM) - S/P bilateral breast reduction   THERAPY DIAG:  Breast pain, left  H/O bilateral breast reduction surgery  Stiffness of left shoulder, not elsewhere classified  ONSET DATE: 09/10/22  Rationale for Evaluation and Treatment: Rehabilitation  SUBJECTIVE:                                                                                                                                                                                           SUBJECTIVE STATEMENT: Feeling good today   EVAL: It is just the left side.  It is like things like reaching up I get a muscle pain.  I also get some mid back pain  (around scapula) I feel like the left breast is maybe a bit higher.  They did say to wait 3-6 months to see if it settles.    PERTINENT HISTORY: bil breast reduction 09/10/22.   PAIN:  Are you having pain? 0/10, when I'm sleeping I just get pain on the sides.    PRECAUTIONS: None  WEIGHT BEARING RESTRICTIONS: No  FALLS:  Has patient fallen in last 6 months? 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   OBSERVATIONS / OTHER ASSESSMENTS: breasts well healed and symmetrical in seated, in supine the left anterior chest/breast appears larger.   POSTURE: WNL  UPPER EXTREMITY AROM/PROM:  A/PROM RIGHT   eval   Shoulder extension 50  Shoulder flexion 155  Shoulder abduction 170  Shoulder internal rotation   Shoulder external rotation 80    (Blank rows = not tested)  A/PROM LEFT   eval 11/25/22 12/02/22  Shoulder extension 50    Shoulder flexion 135 - pull side of breast 155 155  Shoulder abduction 165 - pull in axilla  165 170  Shoulder internal rotation     Shoulder external rotation 80      (Blank rows = not tested)  CERVICAL AROM: All within normal limits: reports some tension due to sleeping on her back  UPPER EXTREMITY STRENGTH:   QUICK DASH SURVEY: 56%on eval, 31% on 12/30/22  BREAST COMPLAINTS QUESTIONNAIRE on 12/30/22 Pain:   3 Heaviness:  2 Swollen feeling:  2 Tense Skin:  5 Redness:  5 Bra Print:  5 Size of Pores:  0 Hard feeling:   5 Total:     27/80 A Score over 9 indicates lymphedema  issues in the breast   TODAY'S TREATMENT:                                                                                                                                          DATE:  01/01/23 Pulleys x into flexion and abduction Wall ball flexion and abduction bil 5x6"  Back against the wall 2# flexion to 90 x 10, scaption to 90 x 10 Cable row 7# x 10 Cable bil extension 3# x 10 Supine chest press 5# x 10  Supine tricep extension 3# x 10 Supine Alternating flexion x 10  MLD towards bil axilla and clavicles   12/30/22 Reassessment of goals and POC extension Pulleys x into flexion and abduction Wall ball flexion and abduction bil 5x6"  Supine: Alternating flexion x 10  Alternating horizontal abd x 10 Pro/Ret x 10 with vcs to keep the neck relaxed yellow x 10: bil ER and bil horizonal abduction with vcs to increase speed and relax the neck, diagonals x 10 bil Open book  x 3 bil with vcs to not focus on breast feeling as much and just perform the motion at normal speed MLD towards bil axilla and clavicles   12/19/22 Pulleys x into flexion and abduction Alternating flexion x 10  Alternating horizontal abd  x 10 Open book bil x 3 with initial instruction Pro/ret with tcs/demo with hands on the wall seated scap yellow x 5: bil ER and bil horizonal abduction Lt shoulder PROM to tolerance MLD towards bil axilla and clavicles   12/09/22 Pt is noting more pain today and left breast appears more red medially today compared to last visit. - see photo pt will see them tomorrow at the MD office for redness check.   Started MLD on the left side today but was deferred this was tender - which it is usually not.   Intact sequence both sides: working bil breast towards nearest lymph nodes (Rt only)  PATIENT EDUCATION:  Education details: initial HEP Person educated: Patient Education method: Explanation, Demonstration, and Tactile cues Education comprehension: verbalized  understanding and returned demonstration  HOME EXERCISE PROGRAM: Access Code: [email or web address] URL: [email or web address] Date: 11/18/2022 Prepared by: Gwenevere Abbot  Exercises - Supine Chest Stretch with Elbows Bent  - 1 x daily - 7 x weekly - 1 sets - 3 reps - 30-60seconds hold - Supine Lower Trunk Rotation  - 1 x daily - 7 x weekly - 1-3 sets - 10 reps - 20-30 seconds hold - Single Arm Doorway Pec Stretch at 90 Degrees Abduction  - 1 x daily - 7 x weekly - 10 reps - 20-30 second hold - Standing Shoulder Abduction Slides at Wall  - 1 x daily - 7 x weekly - 1-3 sets - 10 reps - 20-30 seconds hold  Access Code: [email or web address] URL: [email or web address] Date: 12/02/2022 Prepared by: Gwenevere Abbot  Exercises - Supine Shoulder Horizontal Abduction with Resistance  - 1 x daily - 3 x weekly - 1-3 sets - 10 reps - Supine Shoulder External Rotation with Resistance  - 1 x daily - 3 x weekly - 1-3 sets - 10 reps - Supine PNF D2 Flexion with Resistance  - 1 x daily - 3 x weekly - 1-3 sets - 10 reps - Standing Shoulder Row with Anchored Resistance  - 1 x daily - 3 x weekly - 1-3 sets - 10 reps - Standing Shoulder Flexion to 90 Degrees with Dumbbells  - 1 x daily - 3 x weekly - 1-3 sets - 10 reps - no hold  ASSESSMENT:  CLINICAL IMPRESSION: Focused on return to gym activities today and pt did well.  Continues with slow movements but is getting more confident.  MT not needed much today due to low levels of edema and tenderness.    OBJECTIVE IMPAIRMENTS: decreased activity tolerance, decreased ROM, impaired UE functional use, and pain.   ACTIVITY LIMITATIONS: carrying, lifting, and reach over head  PARTICIPATION LIMITATIONS: cleaning and occupation  PERSONAL FACTORS: none are also affecting patient's functional outcome.   REHAB POTENTIAL: Excellent  CLINICAL DECISION MAKING: Stable/uncomplicated  EVALUATION COMPLEXITY: Low  GOALS: Goals reviewed with patient? Yes  SHORT TERM  GOALS: Target date: 11/18/22   Pt will be ind with initial HEP Baseline: Goal status: MET   LONG TERM GOALS: Target date: 9/16 /24  Pt will improve AROM to full on the left without restriction Baseline:  Goal status: MET  2.  Pt will return to gym activities without restriction Baseline:  Goal status: ONGOING - has not returned to weights but is back to doing cardio and stairmaster  3.  Pt will decrease QDASH to 22% or less  NEW  4. Pt will decrease pain at the inferior breast incisions to 1/10 or less with touch  4/10 baseline  NEW  PLAN:  PT FREQUENCY: 1-2x/week  PT DURATION: 4 weeks  PLANNED INTERVENTIONS: Therapeutic exercises, Patient/Family education, Self Care, Manual therapy, and Re-evaluation, Dry Needling, taping  PLAN FOR NEXT SESSION: Left PROM/AAROM, STM pec, return to gym activities and lifting  Idamae Lusher, PT 01/01/2023, 10:58 AM

## 2023-01-06 ENCOUNTER — Ambulatory Visit: Payer: BC Managed Care – PPO | Admitting: Rehabilitation

## 2023-01-06 ENCOUNTER — Encounter: Payer: Self-pay | Admitting: Rehabilitation

## 2023-01-06 DIAGNOSIS — M25612 Stiffness of left shoulder, not elsewhere classified: Secondary | ICD-10-CM

## 2023-01-06 DIAGNOSIS — N644 Mastodynia: Secondary | ICD-10-CM | POA: Diagnosis not present

## 2023-01-06 DIAGNOSIS — Z9889 Other specified postprocedural states: Secondary | ICD-10-CM

## 2023-01-06 NOTE — Therapy (Signed)
OUTPATIENT PHYSICAL THERAPY  UPPER EXTREMITY ONCOLOGY   Patient Name: Michelle [name] MRN: [id number] DOB:[birth year], 22 y.o., female Today's Date: 01/06/2023  END OF SESSION:  PT End of Session - 01/06/23 0802     Visit Number 10    Number of Visits 16    Date for PT Re-Evaluation 01/27/23    PT Start Time 0803    PT Stop Time 0856    PT Time Calculation (min) 53 min    Activity Tolerance Patient tolerated treatment well    Behavior During Therapy [facility] for tasks assessed/performed               Past Medical History:  Diagnosis Date   Anxiety    Depression    H/O seasonal allergies    Past Surgical History:  Procedure Laterality Date   BREAST REDUCTION SURGERY Bilateral 09/10/2022   Procedure: MAMMARY REDUCTION  (BREAST);  Surgeon: Santiago Glad, MD;  Location: [facility];  Service: Plastics;  Laterality: Bilateral;   WISDOM TOOTH EXTRACTION     Patient Active Problem List   Diagnosis Date Noted   Morbid obesity (HCC) 02/20/2020   Hyperinsulinemia 01/02/2019   Acanthosis nigricans 01/01/2019   Anxiety 01/01/2019   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   PMDD (premenstrual dysphoric disorder) 01/24/2017    PCP: Lia Hopping, MD  REFERRING PROVIDER: Evelena Leyden PA-C  REFERRING DIAG: [id number] (ICD-10-CM) - S/P bilateral breast reduction   THERAPY DIAG:  Breast pain, left  H/O bilateral breast reduction surgery  Stiffness of left shoulder, not elsewhere classified  ONSET DATE: 09/10/22  Rationale for Evaluation and Treatment: Rehabilitation  SUBJECTIVE:                                                                                                                                                                                           SUBJECTIVE STATEMENT: I got Left nipple pain last night and it woke me up. My back has been really tense too.  I think because I can't sleep on my stomach.    EVAL: It is just the  left side.  It is like things like reaching up I get a muscle pain.  I also get some mid back pain (around scapula) I feel like the left breast is maybe a bit higher.  They did say to wait 3-6 months to see if it settles.    PERTINENT HISTORY: bil breast reduction 09/10/22.   PAIN:  Are you having pain? 0/10, when I'm sleeping I just get pain on the sides.    PRECAUTIONS: None  WEIGHT BEARING RESTRICTIONS: No  FALLS:  Has  patient fallen in last 6 months? 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   OBSERVATIONS / OTHER ASSESSMENTS: breasts well healed and symmetrical in seated, in supine the left anterior chest/breast appears larger.   POSTURE: WNL  UPPER EXTREMITY AROM/PROM:  A/PROM RIGHT   eval   Shoulder extension 50  Shoulder flexion 155  Shoulder abduction 170  Shoulder internal rotation   Shoulder external rotation 80    (Blank rows = not tested)  A/PROM LEFT   eval 11/25/22 12/02/22  Shoulder extension 50    Shoulder flexion 135 - pull side of breast 155 155  Shoulder abduction 165 - pull in axilla  165 170  Shoulder internal rotation     Shoulder external rotation 80      (Blank rows = not tested)  CERVICAL AROM: All within normal limits: reports some tension due to sleeping on her back  UPPER EXTREMITY STRENGTH:   QUICK DASH SURVEY: 56%on eval, 31% on 12/30/22  BREAST COMPLAINTS QUESTIONNAIRE on 12/30/22 Pain:   3 Heaviness:  2 Swollen feeling:  2 Tense Skin:  5 Redness:  5 Bra  Print:  5 Size of Pores:  0 Hard feeling:   5 Total:     27/80 A Score over 9 indicates lymphedema issues in the breast   TODAY'S TREATMENT:                                                                                                                                          DATE:  01/06/23 Pulleys x into flexion and abduction Wall ball flexion and abduction bil 5x6" Back against the wall 2# flexion to 90deg x 10, scaption to 90deg x 10 Cable row 7# x 10 Cable bil extension 3# x 10 Supine LTR with chest stretch 3" x 5 bil Childs pose x 30" and Lt / Rt arms  Supine chest press 5# x 10  Supine tricep extension holding 1 5# weight between hands  x 10 Supine Alternating flexion x 10  MLD towards bil axilla and clavicles  01/01/23 Pulleys x into flexion and abduction Wall ball flexion and abduction bil 5x6"  Back against the wall 2# flexion to 90 x 10, scaption to 90 x 10 Cable row 7# x 10 Cable bil extension 3# x 10 Supine chest press 5# x 10  Supine tricep extension 3# x 10 Supine Alternating flexion x 10  MLD towards bil axilla and  clavicles   12/30/22 Reassessment of goals and POC extension Pulleys x into flexion and abduction Wall ball flexion and abduction bil 5x6"  Supine: Alternating flexion x 10  Alternating horizontal abd x 10 Pro/Ret x 10 with vcs to keep the neck relaxed yellow x 10: bil ER and bil horizonal abduction with vcs to increase speed and relax the neck, diagonals x 10 bil Open book  x 3 bil with vcs to not focus on breast feeling as much and just perform the motion at normal speed MLD towards bil axilla and clavicles   12/19/22 Pulleys x into flexion and abduction Alternating flexion x 10  Alternating horizontal abd x 10 Open book bil x 3 with initial instruction Pro/ret with tcs/demo with hands on the wall seated scap yellow x 5: bil ER and bil horizonal abduction Lt shoulder PROM to tolerance MLD towards bil axilla and  clavicles   12/09/22 Pt is noting more pain today and left breast appears more red medially today compared to last visit. - see photo pt will see them tomorrow at the MD office for redness check.   Started MLD on the left side today but was deferred this was tender - which it is usually not.   Intact sequence both sides: working bil breast towards nearest lymph nodes (Rt only)  PATIENT EDUCATION:  Education details: initial HEP Person educated: Patient Education method: Explanation, Demonstration, and Tactile cues Education comprehension: verbalized understanding and returned demonstration  HOME EXERCISE PROGRAM: Access Code: [email or web address] URL: [email or web address] Date: 11/18/2022 Prepared by: Gwenevere Abbot  Exercises - Supine Chest Stretch with Elbows Bent  - 1 x daily - 7 x weekly - 1 sets - 3 reps - 30-60seconds hold - Supine Lower Trunk Rotation  - 1 x daily - 7 x weekly - 1-3 sets - 10 reps - 20-30 seconds hold - Single Arm Doorway Pec Stretch at 90 Degrees Abduction  - 1 x daily - 7 x weekly - 10 reps - 20-30 second hold - Standing Shoulder Abduction Slides at Wall  - 1 x daily - 7 x weekly - 1-3 sets - 10 reps - 20-30 seconds hold  Access Code: [email or web address] URL: [email or web address] Date: 12/02/2022 Prepared by: Gwenevere Abbot  Exercises - Supine Shoulder Horizontal Abduction with Resistance  - 1 x daily - 3 x weekly - 1-3 sets - 10 reps - Supine Shoulder External Rotation with Resistance  - 1 x daily - 3 x weekly - 1-3 sets - 10 reps - Supine PNF D2 Flexion with Resistance  - 1 x daily - 3 x weekly - 1-3 sets - 10 reps - Standing Shoulder Row with Anchored Resistance  - 1 x daily - 3 x weekly - 1-3 sets - 10 reps - Standing Shoulder Flexion to 90 Degrees with Dumbbells  - 1 x daily - 3 x weekly - 1-3 sets - 10 reps - no hold  ASSESSMENT:  CLINICAL IMPRESSION: Focused on return to gym activities today and pt did well.  Moving faster and more confident  today.  MT not needed much today due to low levels of edema and tenderness.    OBJECTIVE IMPAIRMENTS: decreased activity tolerance, decreased ROM, impaired UE functional use, and pain.   ACTIVITY LIMITATIONS: carrying, lifting, and reach over head  PARTICIPATION LIMITATIONS: cleaning and occupation  PERSONAL FACTORS: none are also affecting patient's functional outcome.   REHAB POTENTIAL: Excellent  CLINICAL DECISION MAKING: Stable/uncomplicated  EVALUATION COMPLEXITY: Low  GOALS: Goals reviewed with  patient? Yes  SHORT TERM GOALS: Target date: 11/18/22   Pt will be ind with initial HEP Baseline: Goal status: MET   LONG TERM GOALS: Target date: 9/16 /24  Pt will improve AROM to full on the left without restriction Baseline:  Goal status: MET  2.  Pt will return to gym activities without restriction Baseline:  Goal status: ONGOING - has not returned to weights but is back to doing cardio and stairmaster  3.  Pt will decrease QDASH to 22% or less  NEW  4. Pt will decrease pain at the inferior breast incisions to 1/10 or less with touch  4/10 baseline  NEW  PLAN:  PT FREQUENCY: 1-2x/week  PT DURATION: 4 weeks  PLANNED INTERVENTIONS: Therapeutic exercises, Patient/Family education, Self Care, Manual therapy, and Re-evaluation, Dry Needling, taping  PLAN FOR NEXT SESSION: Left PROM/AAROM, STM pec, return to gym activities and lifting  Idamae Lusher, PT 01/06/2023, 8:56 AM

## 2023-01-10 ENCOUNTER — Encounter: Payer: Self-pay | Admitting: Rehabilitation

## 2023-01-10 ENCOUNTER — Ambulatory Visit: Payer: BC Managed Care – PPO | Admitting: Rehabilitation

## 2023-01-10 DIAGNOSIS — N644 Mastodynia: Secondary | ICD-10-CM

## 2023-01-10 DIAGNOSIS — M25612 Stiffness of left shoulder, not elsewhere classified: Secondary | ICD-10-CM

## 2023-01-10 DIAGNOSIS — Z9889 Other specified postprocedural states: Secondary | ICD-10-CM

## 2023-01-10 NOTE — Therapy (Signed)
OUTPATIENT PHYSICAL THERAPY  UPPER EXTREMITY ONCOLOGY   Patient Name: Michelle [name] MRN: [id number] DOB:03/22/2001, 22 y.o., female Today's Date: 01/10/2023  END OF SESSION:  PT End of Session - 01/10/23 0806     Visit Number 11    Number of Visits 16    Date for PT Re-Evaluation 01/27/23    PT Start Time 0808    PT Stop Time 0850    PT Time Calculation (min) 42 min    Activity Tolerance Patient tolerated treatment well    Behavior During Therapy [facility] for tasks assessed/performed               Past Medical History:  Diagnosis Date   Anxiety    Depression    H/O seasonal allergies    Past Surgical History:  Procedure Laterality Date   BREAST REDUCTION SURGERY Bilateral 09/10/2022   Procedure: MAMMARY REDUCTION  (BREAST);  Surgeon: Santiago Glad, MD;  Location: [facility];  Service: Plastics;  Laterality: Bilateral;   WISDOM TOOTH EXTRACTION     Patient Active Problem List   Diagnosis Date Noted   Morbid obesity (HCC) 02/20/2020   Hyperinsulinemia 01/02/2019   Acanthosis nigricans 01/01/2019   Anxiety 01/01/2019   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   PMDD (premenstrual dysphoric disorder) 01/24/2017    PCP: Lia Hopping, MD  REFERRING PROVIDER: Evelena Leyden PA-C  REFERRING DIAG: [phone] (ICD-10-CM) - S/P bilateral breast reduction   THERAPY DIAG:  Breast pain, left  H/O bilateral breast reduction surgery  Stiffness of left shoulder, not elsewhere classified  ONSET DATE: 09/10/22  Rationale for Evaluation and Treatment: Rehabilitation  SUBJECTIVE:                                                                                                                                                                                           SUBJECTIVE STATEMENT: Still getting intermittent Left breast pain but that is all    EVAL: It is just the left side.  It is like things like reaching up I get a muscle pain.  I also  get some mid back pain (around scapula) I feel like the left breast is maybe a bit higher.  They did say to wait 3-6 months to see if it settles.    PERTINENT HISTORY: bil breast reduction 09/10/22.   PAIN:  Are you having pain? 0/10, when I'm sleeping I just get pain on the sides.    PRECAUTIONS: None  WEIGHT BEARING RESTRICTIONS: No  FALLS:  Has patient fallen in last 6 months? 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   OBSERVATIONS / OTHER ASSESSMENTS: breasts well healed and symmetrical in seated, in supine the left anterior chest/breast appears larger.   POSTURE: WNL  UPPER EXTREMITY AROM/PROM:  A/PROM RIGHT   eval   Shoulder extension 50  Shoulder flexion 155  Shoulder abduction 170  Shoulder internal rotation   Shoulder external rotation 80    (Blank rows = not tested)  A/PROM LEFT   eval 11/25/22 12/02/22  Shoulder extension 50    Shoulder flexion 135 - pull side of breast 155 155  Shoulder abduction 165 - pull in axilla  165 170  Shoulder internal rotation     Shoulder external rotation 80      (Blank rows = not tested)  CERVICAL AROM: All within normal limits: reports some tension due to sleeping on her back  UPPER EXTREMITY STRENGTH:   QUICK DASH SURVEY: 56%on eval, 31% on 12/30/22  BREAST COMPLAINTS QUESTIONNAIRE on 12/30/22 Pain:   3 Heaviness:  2 Swollen feeling:  2 Tense Skin:  5 Redness:  5 Bra Print:  5 Size of Pores:  0 Hard feeling:   5 Total:     27/80 A Score over  9 indicates lymphedema issues in the breast   TODAY'S TREATMENT:                                                                                                                                          DATE:  01/10/23 AROM full and no pull/pn Pulleys x into flexion and abduction (easy) Back against the wall 3# flexion to 90deg  2x 10, scaption to 90deg 2 x 10 Cable row 7#  2x 10 Cable lat pulldown 10# 2x10 - had slight Left breast pain Childs pose x 30" and Lt / Rt arms  Supine chest press 5#  2x 10  Supine Alternating flexion x 10 for stretch Doorway stretch bil 2x20" MLD towards bil axilla and clavicles   01/06/23 Pulleys x into flexion and abduction Wall ball flexion and abduction bil 5x6" Back against the wall 2# flexion to 90deg x 10, scaption to 90deg x 10 Cable row 7# x 10 Cable bil extension 3# x 10 Supine LTR with chest stretch 3" x 5 bil Childs pose x 30" and Lt / Rt arms  Supine chest press 5# x 10  Supine tricep extension holding 1 5# weight between hands  x 10 Supine Alternating flexion x 10  MLD towards bil axilla and clavicles  01/01/23  Pulleys x into flexion and abduction Wall ball flexion and abduction bil 5x6"  Back against the wall 2# flexion to 90 x 10, scaption to 90 x 10 Cable row 7# x 10 Cable bil extension 3# x 10 Supine chest press 5# x 10  Supine tricep extension 3# x 10 Supine Alternating flexion x 10  MLD towards bil axilla and clavicles   12/30/22 Reassessment of goals and POC extension Pulleys x into flexion and abduction Wall ball flexion and abduction bil 5x6"  Supine: Alternating flexion x 10  Alternating horizontal abd x 10 Pro/Ret x 10 with vcs to keep the neck relaxed yellow x 10: bil ER and bil horizonal abduction with vcs to increase speed and relax the neck, diagonals x 10 bil Open book  x 3 bil with vcs to not focus on breast feeling as much and just perform the motion at normal speed MLD towards bil axilla  and clavicles   12/19/22 Pulleys x into flexion and abduction Alternating flexion x 10  Alternating horizontal abd x 10 Open book bil x 3 with initial instruction Pro/ret with tcs/demo with hands on the wall seated scap yellow x 5: bil ER and bil horizonal abduction Lt shoulder PROM to tolerance MLD towards bil axilla and clavicles   12/09/22 Pt is noting more pain today and left breast appears more red medially today compared to last visit. - see photo pt will see them tomorrow at the MD office for redness check.   Started MLD on the left side today but was deferred this was tender - which it is usually not.   Intact sequence both sides: working bil breast towards nearest lymph nodes (Rt only)  PATIENT EDUCATION:  Education details: initial HEP Person educated: Patient Education method: Explanation, Demonstration, and Tactile cues Education comprehension: verbalized understanding and returned demonstration  HOME EXERCISE PROGRAM: Access Code: [email or web address] URL: [email or web address] Date: 11/18/2022 Prepared by: Gwenevere Abbot  Exercises - Supine Chest Stretch with Elbows Bent  - 1 x daily - 7 x weekly - 1 sets - 3 reps - 30-60seconds hold - Supine Lower Trunk Rotation  - 1 x daily - 7 x weekly - 1-3 sets - 10 reps - 20-30 seconds hold - Single Arm Doorway Pec Stretch at 90 Degrees Abduction  - 1 x daily - 7 x weekly - 10 reps - 20-30 second hold - Standing Shoulder Abduction Slides at Wall  - 1 x daily - 7 x weekly - 1-3 sets - 10 reps - 20-30 seconds hold  Access Code: [email or web address] URL: [email or web address] Date: 12/02/2022 Prepared by: Gwenevere Abbot  Exercises - Supine Shoulder Horizontal Abduction with Resistance  - 1 x daily - 3 x weekly - 1-3 sets - 10 reps - Supine Shoulder External Rotation with Resistance  - 1 x daily - 3 x weekly - 1-3 sets - 10 reps - Supine PNF D2 Flexion with Resistance  - 1 x daily - 3 x weekly - 1-3 sets - 10 reps - Standing  Shoulder Row with Anchored Resistance  - 1 x daily - 3 x weekly - 1-3 sets - 10 reps - Standing Shoulder Flexion to 90 Degrees with Dumbbells  - 1 x daily - 3 x weekly - 1-3 sets - 10 reps - no hold  ASSESSMENT:  CLINICAL IMPRESSION: Focused on return to gym activities today and pt did well now with 2 sets and adding lat pulldown.  MT not needed much today due to low  levels of edema and tenderness.    OBJECTIVE IMPAIRMENTS: decreased activity tolerance, decreased ROM, impaired UE functional use, and pain.   ACTIVITY LIMITATIONS: carrying, lifting, and reach over head  PARTICIPATION LIMITATIONS: cleaning and occupation  PERSONAL FACTORS: none are also affecting patient's functional outcome.   REHAB POTENTIAL: Excellent  CLINICAL DECISION MAKING: Stable/uncomplicated  EVALUATION COMPLEXITY: Low  GOALS: Goals reviewed with patient? Yes  SHORT TERM GOALS: Target date: 11/18/22   Pt will be ind with initial HEP Baseline: Goal status: MET   LONG TERM GOALS: Target date: 9/16 /24  Pt will improve AROM to full on the left without restriction Baseline:  Goal status: MET  2.  Pt will return to gym activities without restriction Baseline:  Goal status: ONGOING - has not returned to weights but is back to doing cardio and stairmaster  3.  Pt will decrease QDASH to 22% or less  NEW  4. Pt will decrease pain at the inferior breast incisions to 1/10 or less with touch  4/10 baseline  NEW  PLAN:  PT FREQUENCY: 1-2x/week  PT DURATION: 4 weeks  PLANNED INTERVENTIONS: Therapeutic exercises, Patient/Family education, Self Care, Manual therapy, and Re-evaluation, Dry Needling, taping  PLAN FOR NEXT SESSION: Left PROM/AAROM, STM pec, return to gym activities and lifting  Tasia Liz, Julieanne Manson, PT 01/10/2023, 8:50 AM

## 2023-01-16 ENCOUNTER — Ambulatory Visit: Payer: BC Managed Care – PPO | Attending: Physician Assistant | Admitting: Rehabilitation

## 2023-01-16 DIAGNOSIS — N644 Mastodynia: Secondary | ICD-10-CM | POA: Insufficient documentation

## 2023-01-16 DIAGNOSIS — M25612 Stiffness of left shoulder, not elsewhere classified: Secondary | ICD-10-CM | POA: Insufficient documentation

## 2023-01-16 DIAGNOSIS — Z9889 Other specified postprocedural states: Secondary | ICD-10-CM | POA: Insufficient documentation

## 2023-01-16 NOTE — Therapy (Signed)
OUTPATIENT PHYSICAL THERAPY  UPPER EXTREMITY ONCOLOGY   Patient Name: Michelle [name] MRN: [id number] DOB:2000/05/16, 22 y.o., female Today's Date: 01/16/2023  END OF SESSION:  PT End of Session - 01/16/23 1558     Visit Number 12    Number of Visits 16    Date for PT Re-Evaluation 01/27/23    PT Start Time 1206    PT Stop Time 1236    PT Time Calculation (min) 30 min    Activity Tolerance Patient tolerated treatment well    Behavior During Therapy [facility] for tasks assessed/performed                Past Medical History:  Diagnosis Date   Anxiety    Depression    H/O seasonal allergies    Past Surgical History:  Procedure Laterality Date   BREAST REDUCTION SURGERY Bilateral 09/10/2022   Procedure: MAMMARY REDUCTION  (BREAST);  Surgeon: Santiago Glad, MD;  Location: [facility];  Service: Plastics;  Laterality: Bilateral;   WISDOM TOOTH EXTRACTION     Patient Active Problem List   Diagnosis Date Noted   Morbid obesity (HCC) 02/20/2020   Hyperinsulinemia 01/02/2019   Acanthosis nigricans 01/01/2019   Anxiety 01/01/2019   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   PMDD (premenstrual dysphoric disorder) 01/24/2017    PCP: Lia Hopping, MD  REFERRING PROVIDER: Evelena Leyden PA-C  REFERRING DIAG: [phone] (ICD-10-CM) - S/P bilateral breast reduction   THERAPY DIAG:  H/O bilateral breast reduction surgery  Breast pain, left  Stiffness of left shoulder, not elsewhere classified  ONSET DATE: 09/10/22  Rationale for Evaluation and Treatment: Rehabilitation  SUBJECTIVE:                                                                                                                                                                                           SUBJECTIVE STATEMENT:  I went to the gym and did some machines for the legs and abs and I wasn't too sore.  My scars are the only thing that bothers me.    EVAL: It is just the left  side.  It is like things like reaching up I get a muscle pain.  I also get some mid back pain (around scapula) I feel like the left breast is maybe a bit higher.  They did say to wait 3-6 months to see if it settles.    PERTINENT HISTORY: bil breast reduction 09/10/22.   PAIN:  Are you having pain? 0/10, when I'm sleeping I just get pain on the sides.    PRECAUTIONS: None  WEIGHT BEARING RESTRICTIONS: No  FALLS:  Has patient fallen in last 6 months? 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   OBSERVATIONS / OTHER ASSESSMENTS: breasts well healed and symmetrical in seated, in supine the left anterior chest/breast appears larger.   POSTURE: WNL  UPPER EXTREMITY AROM/PROM:  A/PROM RIGHT   eval   Shoulder extension 50  Shoulder flexion 155  Shoulder abduction 170  Shoulder internal rotation   Shoulder external rotation 80    (Blank rows = not tested)  A/PROM LEFT   eval 11/25/22 12/02/22  Shoulder extension 50    Shoulder flexion 135 - pull side of breast 155 155  Shoulder abduction 165 - pull in axilla  165 170  Shoulder internal rotation     Shoulder external rotation 80      (Blank rows = not tested)  CERVICAL AROM: All within normal limits: reports some tension due to sleeping on her back  UPPER EXTREMITY STRENGTH:   QUICK DASH SURVEY: 56%on eval, 31% on 12/30/22  BREAST COMPLAINTS QUESTIONNAIRE on 12/30/22 Pain:   3 Heaviness:  2 Swollen feeling:  2 Tense Skin:  5 Redness:  5 Bra  Print:  5 Size of Pores:  0 Hard feeling:   5 Total:     27/80 A Score over 9 indicates lymphedema issues in the breast   TODAY'S TREATMENT:                                                                                                                                          DATE:  01/16/23 Deferred treatment as pt has been going to the gym and doing well.  Dicussed final gym recommendations.  Pt helped PT come up with ideas she would have like to have been educated about prior to her reduction to help [place] educate other patients.   All goals met.   01/10/23 AROM full and no pull/pn Pulleys x into flexion and abduction (easy) Back against the wall 3# flexion to 90deg  2x 10, scaption to 90deg 2 x 10 Cable row 7#  2x 10 Cable lat pulldown 10# 2x10 - had slight Left breast pain Childs pose x 30" and Lt / Rt arms  Supine chest press 5#  2x 10  Supine Alternating flexion x 10 for stretch Doorway stretch bil 2x20" MLD towards bil axilla and clavicles   01/06/23 Pulleys x into flexion and abduction Wall ball flexion and abduction bil 5x6" Back against the wall  2# flexion to 90deg x 10, scaption to 90deg x 10 Cable row 7# x 10 Cable bil extension 3# x 10 Supine LTR with chest stretch 3" x 5 bil Childs pose x 30" and Lt / Rt arms  Supine chest press 5# x 10  Supine tricep extension holding 1 5# weight between hands  x 10 Supine Alternating flexion x 10  MLD towards bil axilla and clavicles  01/01/23 Pulleys x into flexion and abduction Wall ball flexion and abduction bil 5x6"  Back against the wall 2# flexion to 90 x 10, scaption to 90 x 10 Cable row 7# x 10 Cable bil extension 3# x 10 Supine chest press 5# x 10  Supine tricep extension 3# x 10 Supine Alternating flexion x 10  MLD towards bil axilla and clavicles   12/30/22 Reassessment of goals and POC extension Pulleys x into flexion and abduction Wall ball flexion and abduction bil 5x6"   Supine: Alternating flexion x 10  Alternating horizontal abd x 10 Pro/Ret x 10 with vcs to keep the neck relaxed yellow x 10: bil ER and bil horizonal abduction with vcs to increase speed and relax the neck, diagonals x 10 bil Open book  x 3 bil with vcs to not focus on breast feeling as much and just perform the motion at normal speed MLD towards bil axilla and clavicles   12/19/22 Pulleys x into flexion and abduction Alternating flexion x 10  Alternating horizontal abd x 10 Open book bil x 3 with initial instruction Pro/ret with tcs/demo with hands on the wall seated scap yellow x 5: bil ER and bil horizonal abduction Lt shoulder PROM to tolerance MLD towards bil axilla and clavicles   12/09/22 Pt is noting more pain today and left breast appears more red medially today compared to last visit. - see photo pt will see them tomorrow at the MD office for redness check.   Started MLD on the left side today but was deferred this was tender - which it is usually not.   Intact sequence both sides: working bil breast towards nearest lymph nodes (Rt only)  PATIENT EDUCATION:  Education details: initial HEP Person educated: Patient Education method: Explanation, Demonstration, and Tactile cues Education comprehension: verbalized understanding and returned demonstration  HOME EXERCISE PROGRAM: Access Code: [email or web address] URL: [email or web address] Date: 11/18/2022 Prepared by: Gwenevere Abbot  Exercises - Supine Chest Stretch with Elbows Bent  - 1 x daily - 7 x weekly - 1 sets - 3 reps - 30-60seconds hold - Supine Lower Trunk Rotation  - 1 x daily - 7 x weekly - 1-3 sets - 10 reps - 20-30 seconds hold - Single Arm Doorway Pec Stretch at 90 Degrees Abduction  - 1 x daily - 7 x weekly - 10 reps - 20-30 second hold - Standing Shoulder Abduction Slides at Wall  - 1 x daily - 7 x weekly - 1-3 sets - 10 reps - 20-30 seconds hold  Access Code: [email or web address] URL:  [email or web address] Date: 12/02/2022 Prepared by: Gwenevere Abbot  Exercises - Supine Shoulder Horizontal Abduction with Resistance  - 1 x daily - 3 x weekly - 1-3 sets - 10 reps - Supine Shoulder External Rotation with Resistance  - 1 x daily - 3 x weekly - 1-3 sets - 10 reps - Supine PNF D2 Flexion with Resistance  - 1 x daily - 3 x weekly - 1-3 sets - 10 reps - Standing Shoulder Row with Anchored Resistance  -  1 x daily - 3 x weekly - 1-3 sets - 10 reps - Standing Shoulder Flexion to 90 Degrees with Dumbbells  - 1 x daily - 3 x weekly - 1-3 sets - 10 reps - no hold  ASSESSMENT:  CLINICAL IMPRESSION: Pt is ready for DC with return to gym activities without pain or limitations.  All goals are met.   OBJECTIVE IMPAIRMENTS: decreased activity tolerance, decreased ROM, impaired UE functional use, and pain.   ACTIVITY LIMITATIONS: carrying, lifting, and reach over head  PARTICIPATION LIMITATIONS: cleaning and occupation  PERSONAL FACTORS: none are also affecting patient's functional outcome.   REHAB POTENTIAL: Excellent  CLINICAL DECISION MAKING: Stable/uncomplicated  EVALUATION COMPLEXITY: Low  GOALS: Goals reviewed with patient? Yes  SHORT TERM GOALS: Target date: 11/18/22   Pt will be ind with initial HEP Baseline: Goal status: MET   LONG TERM GOALS: Target date: 9/16 /24  Pt will improve AROM to full on the left without restriction Baseline:  Goal status: MET  2.  Pt will return to gym activities without restriction Baseline:  Goal status: MET  3.  Pt will decrease QDASH to 22% or less  11%  MET  4. Pt will decrease pain at the inferior breast incisions to 1/10 or less with touch  4/10 baseline  MET  PLAN:  PT FREQUENCY: 1-2x/week  PT DURATION: 4 weeks  PLANNED INTERVENTIONS: Therapeutic exercises, Patient/Family education, Self Care, Manual therapy, and Re-evaluation, Dry Needling, taping  PLAN FOR NEXT SESSION: Left PROM/AAROM, STM pec,  return to gym activities and lifting  Bibiana Gillean, Julieanne Manson, PT 01/16/2023, 3:59 PM  PHYSICAL THERAPY DISCHARGE SUMMARY  Visits from Start of Care: 12  Current functional level related to goals / functional outcomes: All goals are met   Remaining deficits: Keloid scaring and tenderness   Education / Equipment: Final self care and HEP  Plan: Patient agrees to discharge.  Patient is being discharged due to meeting the stated rehab goals.

## 2023-01-21 ENCOUNTER — Encounter: Payer: BC Managed Care – PPO | Admitting: Rehabilitation

## 2023-01-24 ENCOUNTER — Encounter: Payer: BC Managed Care – PPO | Admitting: Rehabilitation

## 2023-01-27 ENCOUNTER — Encounter: Payer: BC Managed Care – PPO | Admitting: Rehabilitation

## 2023-02-03 ENCOUNTER — Encounter: Payer: BC Managed Care – PPO | Admitting: Rehabilitation

## 2023-02-05 ENCOUNTER — Encounter: Payer: BC Managed Care – PPO | Admitting: Rehabilitation

## 2023-02-17 ENCOUNTER — Encounter: Payer: Self-pay | Admitting: Plastic Surgery

## 2023-02-17 ENCOUNTER — Ambulatory Visit (INDEPENDENT_AMBULATORY_CARE_PROVIDER_SITE_OTHER): Payer: BC Managed Care – PPO | Admitting: Plastic Surgery

## 2023-02-17 VITALS — BP 113/69 | HR 60 | Ht 62.0 in | Wt 185.0 lb

## 2023-02-17 DIAGNOSIS — L910 Hypertrophic scar: Secondary | ICD-10-CM

## 2023-02-17 DIAGNOSIS — Z9889 Other specified postprocedural states: Secondary | ICD-10-CM

## 2023-02-17 NOTE — Progress Notes (Signed)
Ms Michelle [name] returns today approximately 5 months postop after bilateral breast reduction.  She is doing well from the standpoint of improved upper back and neck pain and is happy with the overall shape and size of her breast.  She does note that she still has some discomfort in the incisions and she has thickening of the incision on the medial aspect of the inframammary incision on the left.  On examination all incisions are clean dry and intact.  She is healed nicely.  She does have some hypertrophy of the scars on the left areola and on the medial aspect of the left inframammary incision.  The right side is generally flat and not hypertrophic.   Discussed continued scar massage and silicone tape.  When she is 1 year out from surgery will consider revision of the scar including injection with steroids.  Would prefer not to do that at this time.  Have though encouraged her to return if her scars become thicker in size between now and next March.

## 2023-07-14 ENCOUNTER — Ambulatory Visit: Payer: BC Managed Care – PPO | Admitting: Plastic Surgery

## 2023-07-30 NOTE — Progress Notes (Unsigned)
   Referring Provider Toma Deiters, MD [address] [place],  [place] [address]   CC: No chief complaint on file.     Michelle [name] is an 23 y.o. female.  HPI: Patient is a pleasant 23 year old female with PMH of macromastia s/p bilateral breast reduction performed 09/10/2022 by Dr. Ladona Ridgel who presents to clinic for follow-up.  She was last seen here in clinic on 02/17/2023.  At that time, she endorsed some discomfort in the incisions and reported thickening the scars.  Some hypertrophy of the scar noted to the left areola as well as medial aspect of left inframammary incision.  No significant hypertrophy noted on the right side.  Discussed continued silicone scar gel and mechanical massage.  Would consider scar revision versus steroid injection 1 year from surgery.  Today,  No Known Allergies  Outpatient Encounter Medications as of 07/31/2023  Medication Sig   atomoxetine (STRATTERA) 40 MG capsule Take 40 mg by mouth every morning. (Patient not taking: Reported on 02/17/2023)   ondansetron (ZOFRAN-ODT) 4 MG disintegrating tablet Take 1 tablet (4 mg total) by mouth every 8 (eight) hours as needed for up to 20 doses for nausea or vomiting. (Patient not taking: Reported on 02/17/2023)   topiramate (TOPAMAX) 50 MG tablet Take 50 mg by mouth 2 (two) times daily.   [DISCONTINUED] metFORMIN (GLUCOPHAGE) 500 MG tablet Crush one tablet and take by mouth twice a day with foods   No facility-administered encounter medications on file as of 07/31/2023.     Past Medical History:  Diagnosis Date   Anxiety    Depression    H/O seasonal allergies     Past Surgical History:  Procedure Laterality Date   BREAST REDUCTION SURGERY Bilateral 09/10/2022   Procedure: MAMMARY REDUCTION  (BREAST);  Surgeon: Santiago Glad, MD;  Location: [facility];  Service: Plastics;  Laterality: Bilateral;   WISDOM TOOTH EXTRACTION      No family history on file.  Social History   Social  History Narrative   Not on file     Review of Systems General: Denies fevers or chills Cardio: Denies chest pain Pulmonary: Denies difficulty breathing  Physical Exam    02/17/2023    8:41 AM 12/11/2022    9:40 AM 11/07/2022    3:48 PM  Vitals with BMI  Height 5\' 2"     Weight 185 lbs    BMI 33.83    Systolic 113 103 604  Diastolic 69 68 81  Pulse 60 73 92    General:  No acute distress, nontoxic appearing  Respiratory: No increased work of breathing Neuro: Alert and oriented Psychiatric: Normal mood and affect   Assessment/Plan ***  Evelena Leyden PA-C 07/30/2023, 11:44 AM

## 2023-07-31 ENCOUNTER — Ambulatory Visit: Payer: BC Managed Care – PPO | Admitting: Physician Assistant

## 2023-07-31 VITALS — BP 97/70 | HR 77

## 2023-07-31 DIAGNOSIS — N6459 Other signs and symptoms in breast: Secondary | ICD-10-CM

## 2023-07-31 DIAGNOSIS — Z9889 Other specified postprocedural states: Secondary | ICD-10-CM

## 2023-10-08 ENCOUNTER — Ambulatory Visit: Admitting: Advanced Practice Midwife

## 2023-10-14 ENCOUNTER — Encounter: Admitting: Obstetrics and Gynecology

## 2023-10-31 NOTE — Progress Notes (Signed)
 Referring Provider Orpha Yancey LABOR, MD [address] [place],  [place] [address]   CC:  Chief Complaint  Patient presents with   Follow-up   Skin Problem      Michelle [name] is an 23 y.o. female.  HPI: Patient is a pleasant 23 year old female with PMH of macromastia s/p bilateral breast reduction performed 09/10/2022 by Dr. Waddell who presents to clinic for follow-up.  She was last seen here in clinic on 07/31/2023.  At that time, she felt as though the inframammary hypertrophy had improved since previous encounter with scar gel mechanical massage.  However, there was a bump at the 6 o'clock position left NAC that remained firm.  Discussed possible return to the clinic for consideration of steroid injection.  Today, she is doing well.  She does state that the right areola appears more relaxed than the left areola which is seemingly a bit more contracted.  She continues to note areas of firmness along her left NAC, most notably at the 6 o'clock position.  However, there are also small areas of firmness at the 11:00 and 5:00 positions.  She does feel as though they have improved slightly with the silicone scar tapes, but is interested in injection today.  She states that she has not been performing any mechanic massage given that she has not had the time.   No Known Allergies  Outpatient Encounter Medications as of 11/03/2023  Medication Sig   atomoxetine (STRATTERA) 40 MG capsule Take 40 mg by mouth every morning. (Patient not taking: Reported on 11/03/2023)   ondansetron  (ZOFRAN -ODT) 4 MG disintegrating tablet Take 1 tablet (4 mg total) by mouth every 8 (eight) hours as needed for up to 20 doses for nausea or vomiting. (Patient not taking: Reported on 11/03/2023)   topiramate (TOPAMAX) 50 MG tablet Take 50 mg by mouth 2 (two) times daily. (Patient not taking: Reported on 11/03/2023)   [DISCONTINUED] metFORMIN  (GLUCOPHAGE ) 500 MG tablet Crush one tablet and take by mouth twice a day  with foods   No facility-administered encounter medications on file as of 11/03/2023.     Past Medical History:  Diagnosis Date   Anxiety    Depression    H/O seasonal allergies     Past Surgical History:  Procedure Laterality Date   BREAST REDUCTION SURGERY Bilateral 09/10/2022   Procedure: MAMMARY REDUCTION  (BREAST);  Surgeon: Waddell Leonce NOVAK, MD;  Location: [facility];  Service: Plastics;  Laterality: Bilateral;   WISDOM TOOTH EXTRACTION      No family history on file.  Social History   Social History Narrative   Not on file     Review of Systems General: Denies fevers or chills ENT: Endorses recent nasal congestion Skin: Endorses areas of firmness  Physical Exam    11/03/2023    8:39 AM 07/31/2023   10:39 AM 02/17/2023    8:41 AM  Vitals with BMI  Height   5' 2  Weight   185 lbs  BMI   33.83  Systolic 122 97 113  Diastolic 83 70 69  Pulse 66 77 60    General:  No acute distress, nontoxic appearing  Respiratory: No increased work of breathing Neuro: Alert and oriented Psychiatric: Normal mood and affect  Breasts: Breast with excellent shape and symmetry, soft throughout.  Small, less than 1 cm areas of hypertrophic scarring at the 11:00, 5:00, and 6 o'clock position left NAC.  Otherwise, she is healing appropriately.  Assessment/Plan  Hypertrophic  scarring, left areola: - After discussion of risks and benefits, patient would like to proceed with Kenalog  injection to her small areas of scar hypertrophy left NAC. - Emphasized the importance of frequent mechanical massage to help the scars to soften as well. - Continue with silicone scar management. - She can follow-up in 8 to 12 weeks for reevaluation and consideration of additional steroid injection.   Procedure Note   Preoperative Dx: hypertrophic scarring of left areola x 3    Postoperative Dx: Same   Procedure: kenalog  injection to left areola   Anesthesia: Lidocaine  1% with  1:100,000 epinephrine     Description of Procedure: Risks and complications were explained to the patient.  Consent was confirmed and the patient understands the risks and benefits.  The potential complications and alternatives were explained and the patient consents.  The patient expressed understanding the option of not having the procedure and the risks of a scar.  Time out was called and all information was confirmed to be correct.   The area was prepped and drapped.  Lidocaine  1% with epinepherine 0.3 cc was mixed with 0.3 cc of kenalog  40/1.  The three areas of hypertrophy on the areola were injected with the entirety of the mixture.  A dressing was applied.  The patient was given instructions on how to care for the area and a follow up appointment.  [name] tolerated the procedure well and there were no complications.    [name] PA-C 11/03/2023, 9:18 AM

## 2023-11-03 ENCOUNTER — Ambulatory Visit (INDEPENDENT_AMBULATORY_CARE_PROVIDER_SITE_OTHER): Admitting: Physician Assistant

## 2023-11-03 VITALS — BP 122/83 | HR 66

## 2023-11-03 DIAGNOSIS — Z9889 Other specified postprocedural states: Secondary | ICD-10-CM

## 2023-11-03 DIAGNOSIS — L910 Hypertrophic scar: Secondary | ICD-10-CM

## 2024-01-13 ENCOUNTER — Ambulatory Visit: Admitting: Physician Assistant

## 2024-01-13 NOTE — Progress Notes (Unsigned)
   Referring Provider Orpha Yancey LABOR, MD [address] [place],  [place] [address]   CC: No chief complaint on file.     Michelle [name] is an 23 y.o. female.  HPI: Patient is a pleasant 23 year old female with PMH of macromastia s/p bilateral breast reduction performed 09/10/2022 by Dr. Waddell who presents to clinic for follow-up.   She was last seen here in clinic on 11/03/2023.  At that time, had a couple areas of scar hypertrophy that concerned her.  After discussion of the risks and benefits of steroid injections, Kenalog  was injected along areas of hypertrophy left NAC.  Emphasized the importance of frequent mechanical massage and silicone scar management.  Follow-up in 8 to 12 weeks for reevaluation.  Today,   No Known Allergies  Outpatient Encounter Medications as of 01/13/2024  Medication Sig   atomoxetine (STRATTERA) 40 MG capsule Take 40 mg by mouth every morning. (Patient not taking: Reported on 11/03/2023)   ondansetron  (ZOFRAN -ODT) 4 MG disintegrating tablet Take 1 tablet (4 mg total) by mouth every 8 (eight) hours as needed for up to 20 doses for nausea or vomiting. (Patient not taking: Reported on 11/03/2023)   topiramate (TOPAMAX) 50 MG tablet Take 50 mg by mouth 2 (two) times daily. (Patient not taking: Reported on 11/03/2023)   [DISCONTINUED] metFORMIN  (GLUCOPHAGE ) 500 MG tablet Crush one tablet and take by mouth twice a day with foods   No facility-administered encounter medications on file as of 01/13/2024.     Past Medical History:  Diagnosis Date   Anxiety    Depression    H/O seasonal allergies     Past Surgical History:  Procedure Laterality Date   BREAST REDUCTION SURGERY Bilateral 09/10/2022   Procedure: MAMMARY REDUCTION  (BREAST);  Surgeon: Waddell Leonce NOVAK, MD;  Location: [facility];  Service: Plastics;  Laterality: Bilateral;   WISDOM TOOTH EXTRACTION      No family history on file.  Social History   Social History Narrative    Not on file     Review of Systems General: Denies fevers or chills Cardio: Denies chest pain Pulmonary: Denies difficulty breathing  Physical Exam    11/03/2023    8:39 AM 07/31/2023   10:39 AM 02/17/2023    8:41 AM  Vitals with BMI  Height   5' 2  Weight   185 lbs  BMI   33.83  Systolic 122 97 113  Diastolic 83 70 69  Pulse 66 77 60    General:  No acute distress, nontoxic appearing  Respiratory: No increased work of breathing Neuro: Alert and oriented Psychiatric: Normal mood and affect   Assessment/Plan ***  [name] PA-C 01/13/2024, 7:59 AM
